# Patient Record
Sex: Female | Born: 1990 | Race: White | Hispanic: No | Marital: Married | Smoking: Former smoker
Health system: Southern US, Community
[De-identification: ages and names within clinical notes are randomized; demographics above are authoritative.]

## PROBLEM LIST (undated history)

## (undated) ENCOUNTER — Inpatient Hospital Stay (HOSPITAL_COMMUNITY): Payer: Self-pay

## (undated) DIAGNOSIS — N920 Excessive and frequent menstruation with regular cycle: Secondary | ICD-10-CM

## (undated) DIAGNOSIS — R1031 Right lower quadrant pain: Secondary | ICD-10-CM

## (undated) DIAGNOSIS — K589 Irritable bowel syndrome without diarrhea: Secondary | ICD-10-CM

## (undated) DIAGNOSIS — R319 Hematuria, unspecified: Secondary | ICD-10-CM

## (undated) DIAGNOSIS — F988 Other specified behavioral and emotional disorders with onset usually occurring in childhood and adolescence: Secondary | ICD-10-CM

## (undated) DIAGNOSIS — R635 Abnormal weight gain: Secondary | ICD-10-CM

## (undated) DIAGNOSIS — Z309 Encounter for contraceptive management, unspecified: Principal | ICD-10-CM

## (undated) DIAGNOSIS — N946 Dysmenorrhea, unspecified: Secondary | ICD-10-CM

## (undated) DIAGNOSIS — R350 Frequency of micturition: Principal | ICD-10-CM

## (undated) HISTORY — DX: Other specified behavioral and emotional disorders with onset usually occurring in childhood and adolescence: F98.8

## (undated) HISTORY — PX: KNEE ARTHROSCOPY: SHX127

## (undated) HISTORY — PX: WISDOM TOOTH EXTRACTION: SHX21

## (undated) HISTORY — DX: Irritable bowel syndrome without diarrhea: K58.9

## (undated) HISTORY — DX: Abnormal weight gain: R63.5

## (undated) HISTORY — DX: Right lower quadrant pain: R10.31

## (undated) HISTORY — DX: Hematuria, unspecified: R31.9

## (undated) HISTORY — DX: Encounter for contraceptive management, unspecified: Z30.9

## (undated) HISTORY — DX: Excessive and frequent menstruation with regular cycle: N92.0

## (undated) HISTORY — DX: Dysmenorrhea, unspecified: N94.6

## (undated) HISTORY — DX: Frequency of micturition: R35.0

---
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
## 2012-06-26 ENCOUNTER — Ambulatory Visit (INDEPENDENT_AMBULATORY_CARE_PROVIDER_SITE_OTHER): Payer: Federal, State, Local not specified - PPO | Admitting: Adult Health

## 2012-06-26 ENCOUNTER — Encounter: Payer: Self-pay | Admitting: Adult Health

## 2012-06-26 VITALS — BP 124/86 | HR 82 | Ht 64.5 in | Wt 174.0 lb

## 2012-06-26 DIAGNOSIS — Z3049 Encounter for surveillance of other contraceptives: Secondary | ICD-10-CM

## 2012-06-26 DIAGNOSIS — N920 Excessive and frequent menstruation with regular cycle: Secondary | ICD-10-CM

## 2012-06-26 DIAGNOSIS — Z309 Encounter for contraceptive management, unspecified: Secondary | ICD-10-CM

## 2012-06-26 DIAGNOSIS — F988 Other specified behavioral and emotional disorders with onset usually occurring in childhood and adolescence: Secondary | ICD-10-CM

## 2012-06-26 DIAGNOSIS — N946 Dysmenorrhea, unspecified: Secondary | ICD-10-CM

## 2012-06-26 HISTORY — DX: Dysmenorrhea, unspecified: N94.6

## 2012-06-26 HISTORY — DX: Excessive and frequent menstruation with regular cycle: N92.0

## 2012-06-26 NOTE — Progress Notes (Signed)
Subjective:     Patient ID: Margaret Pierce, female   DOB: [birth year], 22 y.o.   MRN: [id number]  Margaret Pierce is a 22 year old WF in today to discuss birth control and her periods, which have been heavy and painful.She sees Dr. Dwana Melena. She recently had her pills changed and it has not been a month yet. Will continue these for a total of 3 months if not  Better will change at follow up visit.   Review of Systems Patient denies any blurred vision, shortness of breath, chest pain, abdominal pain, problems with bowel movements, urination, or intercourse. She does have occasional headache, and she says her periods are heavy, lasting 7 days with cramps. She changes her tampons every 3-4 hours. She says she uses OTC Pamprin some. Denies any mood changes. Past medical, surgical, social and family history. Reviewed medications and allergies.     Objective:   Physical Exam Vital signs: Blood pressure 124/86, pulse 82 and regular, weight 174 lbs., height 64.5 inches. BMI 24.42. skin is warm and dry, neck midline trachea and thyroid normal, lungs clear to auscultation bilaterally, cardiovascular regular rate and rhythm.     Assessment:      Contraceptive management Menorrhagia Dysmenorrhea History of ADD    Plan:      Take Microgestin Fe 1.5-30 as prescribed. Return to clinic in 3 months in follow-up to discuss pills and periods, if not better will change pills.

## 2012-06-26 NOTE — Patient Instructions (Addendum)
Continue current pills, if after the 3rd pack periods not good, call me and we will change birth control pills. Pap at 23. Sign up for my chart

## 2012-07-16 ENCOUNTER — Encounter: Payer: Self-pay | Admitting: *Deleted

## 2012-07-16 NOTE — Progress Notes (Signed)
rx for vyvanse 70mg  dated for 04/23/2012 wasn't picked up and was shredded on 07/16/2012

## 2012-09-26 ENCOUNTER — Ambulatory Visit (INDEPENDENT_AMBULATORY_CARE_PROVIDER_SITE_OTHER): Payer: Federal, State, Local not specified - PPO | Admitting: Adult Health

## 2012-09-26 ENCOUNTER — Encounter: Payer: Self-pay | Admitting: Adult Health

## 2012-09-26 VITALS — BP 112/72 | Ht 64.5 in | Wt 179.0 lb

## 2012-09-26 DIAGNOSIS — Z3202 Encounter for pregnancy test, result negative: Secondary | ICD-10-CM

## 2012-09-26 DIAGNOSIS — Z3200 Encounter for pregnancy test, result unknown: Secondary | ICD-10-CM

## 2012-09-26 DIAGNOSIS — N920 Excessive and frequent menstruation with regular cycle: Secondary | ICD-10-CM

## 2012-09-26 DIAGNOSIS — Z309 Encounter for contraceptive management, unspecified: Secondary | ICD-10-CM

## 2012-09-26 DIAGNOSIS — Z3049 Encounter for surveillance of other contraceptives: Secondary | ICD-10-CM

## 2012-09-26 DIAGNOSIS — N946 Dysmenorrhea, unspecified: Secondary | ICD-10-CM

## 2012-09-26 LAB — POCT URINE PREGNANCY: Preg Test, Ur: NEGATIVE

## 2012-09-26 MED ORDER — NORGESTIMATE-ETH ESTRADIOL 0.25-35 MG-MCG PO TABS: 1.0000 | ORAL_TABLET | Freq: Every day | ORAL | Status: DC

## 2012-09-26 NOTE — Progress Notes (Signed)
Subjective:     Patient ID: Margaret Pierce, female   DOB: Jun 11, 1990, 22 y.o.   MRN: [id number]  HPI Margaret Pierce is back today to review how she is doing on the Loestrin 1.5/30 fe.She still has heavy flow and cramps, and desires a change in pills.  Review of Systems Positives in HPI Reviewed past medical,surgical, social and family history. Reviewed medications and allergies.     Objective:   Physical Exam BP 112/72  Ht 5' 4.5" (1.638 m)  Wt 179 lb (81.194 kg)  BMI 30.26 kg/m2  LMP 06/24/2014Urine pregnancy test negative.Will try higher dose pill.    Assessment:      Contraceptive management Menorrhagia Dysmenorrhea    Plan:     Rx Sprintec disp 1 pack take 1 daily with 11 refills, can start today and use condoms Return in 3 months for follow up  Call prn problems or concerns

## 2012-09-26 NOTE — Patient Instructions (Addendum)
Start sprintec today  follow up in 3 months USe condoms

## 2012-12-29 ENCOUNTER — Ambulatory Visit: Payer: Federal, State, Local not specified - PPO | Admitting: Adult Health

## 2013-05-27 ENCOUNTER — Other Ambulatory Visit: Payer: Self-pay | Admitting: Adult Health

## 2013-08-18 ENCOUNTER — Telehealth: Payer: Self-pay | Admitting: Adult Health

## 2013-08-18 NOTE — Telephone Encounter (Signed)
Pt states due to weight gain would like to switch to another birth control. Call transferred to front staff for an appt to be made with Cyril MourningJennifer Griffin, NP.

## 2013-08-19 ENCOUNTER — Ambulatory Visit (INDEPENDENT_AMBULATORY_CARE_PROVIDER_SITE_OTHER): Payer: Federal, State, Local not specified - PPO | Admitting: Adult Health

## 2013-08-19 ENCOUNTER — Encounter: Payer: Self-pay | Admitting: Adult Health

## 2013-08-19 VITALS — BP 112/78 | Ht 64.5 in | Wt 192.0 lb

## 2013-08-19 DIAGNOSIS — Z3049 Encounter for surveillance of other contraceptives: Secondary | ICD-10-CM

## 2013-08-19 DIAGNOSIS — R635 Abnormal weight gain: Secondary | ICD-10-CM

## 2013-08-19 DIAGNOSIS — R319 Hematuria, unspecified: Secondary | ICD-10-CM

## 2013-08-19 DIAGNOSIS — Z309 Encounter for contraceptive management, unspecified: Secondary | ICD-10-CM

## 2013-08-19 DIAGNOSIS — Z3202 Encounter for pregnancy test, result negative: Secondary | ICD-10-CM

## 2013-08-19 DIAGNOSIS — R300 Dysuria: Secondary | ICD-10-CM

## 2013-08-19 HISTORY — DX: Abnormal weight gain: R63.5

## 2013-08-19 HISTORY — DX: Encounter for contraceptive management, unspecified: Z30.9

## 2013-08-19 LAB — POCT URINALYSIS DIPSTICK
GLUCOSE UA: NEGATIVE
Ketones, UA: NEGATIVE
LEUKOCYTES UA: NEGATIVE
Nitrite, UA: NEGATIVE
PROTEIN UA: NEGATIVE
RBC UA: 3

## 2013-08-19 LAB — POCT URINE PREGNANCY: PREG TEST UR: NEGATIVE

## 2013-08-19 MED ORDER — NORETHIN ACE-ETH ESTRAD-FE 1-20 MG-MCG(24) PO CHEW: CHEWABLE_TABLET | ORAL | Status: DC

## 2013-08-19 NOTE — Patient Instructions (Signed)
Oral Contraception Use Oral contraceptive pills (OCPs) are medicines taken to prevent pregnancy. OCPs work by preventing the ovaries from releasing eggs. The hormones in OCPs also cause the cervical mucus to thicken, preventing the sperm from entering the uterus. The hormones also cause the uterine lining to become thin, not allowing a fertilized egg to attach to the inside of the uterus. OCPs are highly effective when taken exactly as prescribed. However, OCPs do not prevent sexually transmitted diseases (STDs). Safe sex practices, such as using condoms along with an OCP, can help prevent STDs. Before taking OCPs, you may have a physical exam and Pap test. Your health care provider may also order blood tests if necessary. Your health care provider will make sure you are a good candidate for oral contraception. Discuss with your health care provider the possible side effects of the OCP you may be prescribed. When starting an OCP, it can take 2 to 3 months for the body to adjust to the changes in hormone levels in your body.  HOW TO TAKE ORAL CONTRACEPTIVE PILLS Your health care provider may advise you on how to start taking the first cycle of OCPs. Otherwise, you can:   Start on day 1 of your menstrual period. You will not need any backup contraceptive protection with this start time.   Start on the first Sunday after your menstrual period or the day you get your prescription. In these cases, you will need to use backup contraceptive protection for the first week.   Start the pill at any time of your cycle. If you take the pill within 5 days of the start of your period, you are protected against pregnancy right away. In this case, you will not need a backup form of birth control. If you start at any other time of your menstrual cycle, you will need to use another form of birth control for 7 days. If your OCP is the type called a minipill, it will protect you from pregnancy after taking it for 2 days (48  hours). After you have started taking OCPs:   If you forget to take 1 pill, take it as soon as you remember. Take the next pill at the regular time.   If you miss 2 or more pills, call your health care provider because different pills have different instructions for missed doses. Use backup birth control until your next menstrual period starts.   If you use a 28-day pack that contains inactive pills and you miss 1 of the last 7 pills (pills with no hormones), it will not matter. Throw away the rest of the nonhormone pills and start a new pill pack.  No matter which day you start the OCP, you will always start a new pack on that same day of the week. Have an extra pack of OCPs and a backup contraceptive method available in case you miss some pills or lose your OCP pack.  HOME CARE INSTRUCTIONS   Do not smoke.   Always use a condom to protect against STDs. OCPs do not protect against STDs.   Use a calendar to mark your menstrual period days.   Read the information and directions that came with your OCP. Talk to your health care provider if you have questions.  SEEK MEDICAL CARE IF:   You develop nausea and vomiting.   You have abnormal vaginal discharge or bleeding.   You develop a rash.   You miss your menstrual period.   You are losing   your hair.   You need treatment for mood swings or depression.   You get dizzy when taking the OCP.   You develop acne from taking the OCP.   You become pregnant.  SEEK IMMEDIATE MEDICAL CARE IF:   You develop chest pain.   You develop shortness of breath.   You have an uncontrolled or severe headache.   You develop numbness or slurred speech.   You develop visual problems.   You develop pain, redness, and swelling in the legs.  Document Released: 03/08/2011 Document Revised: 11/19/2012 Document Reviewed: 09/07/2012 [facility] Patient Information 2014 [place], [place]. Start OCs Sunday  Use condoms Return in 3  months for pap

## 2013-08-19 NOTE — Progress Notes (Signed)
Subjective:     Patient ID: Benard HalstedKalynn L Dillard, female   DOB: 08/18/1990, 22 y.o.   MRN: [id number]  HPI [name] is a 23 year old white female, single in complaining of weight gain with OCs, is going to weight watchers.She also complains of burning with urination at times.Is on pills to help with periods.  Review of Systems See HPI Reviewed past medical,surgical, social and family history. Reviewed medications and allergies.     Objective:   Physical Exam BP 112/78  Ht 5' 4.5" (1.638 m)  Wt 192 lb (87.091 kg)  BMI 32.46 kg/m2On period now, UPT negative, urine dipstick 3+ blood,talk only, will try changing OCs to see if helps shorten periods and helps with weight, did discuss other options like depo, nexplanon and IUD or nuva ring and she wants OCs.    Assessment:     Contraceptive management Weight gain Hematuria Burns with urination     Plan:    UA C&S sent  Given 4 packs minastrin lot #[id number] A exp 5/16, start Sunday,  Return in 3 months for pap and physical and ROS  Use condoms   Review handout on OC use

## 2013-08-20 LAB — URINALYSIS
BILIRUBIN URINE: NEGATIVE
Glucose, UA: NEGATIVE mg/dL
Ketones, ur: NEGATIVE mg/dL
Leukocytes, UA: NEGATIVE
Nitrite: NEGATIVE
PH: 6.5 (ref 5.0–8.0)
Protein, ur: NEGATIVE mg/dL
Specific Gravity, Urine: 1.011 (ref 1.005–1.030)
Urobilinogen, UA: 0.2 mg/dL (ref 0.0–1.0)

## 2013-08-21 LAB — URINE CULTURE
Colony Count: NO GROWTH
ORGANISM ID, BACTERIA: NO GROWTH

## 2013-11-18 ENCOUNTER — Ambulatory Visit (INDEPENDENT_AMBULATORY_CARE_PROVIDER_SITE_OTHER): Payer: Federal, State, Local not specified - PPO | Admitting: Adult Health

## 2013-11-18 ENCOUNTER — Encounter: Payer: Self-pay | Admitting: Adult Health

## 2013-11-18 ENCOUNTER — Other Ambulatory Visit (HOSPITAL_COMMUNITY)
Admission: RE | Admit: 2013-11-18 | Discharge: 2013-11-18 | Disposition: A | Discharge status: Home / Self Care | Payer: Federal, State, Local not specified - PPO | Source: Ambulatory Visit | Attending: Adult Health | Admitting: Adult Health

## 2013-11-18 VITALS — BP 120/76 | HR 78 | Ht 65.0 in | Wt 193.0 lb

## 2013-11-18 DIAGNOSIS — Z01419 Encounter for gynecological examination (general) (routine) without abnormal findings: Secondary | ICD-10-CM

## 2013-11-18 DIAGNOSIS — Z3041 Encounter for surveillance of contraceptive pills: Secondary | ICD-10-CM

## 2013-11-18 DIAGNOSIS — Z113 Encounter for screening for infections with a predominantly sexual mode of transmission: Secondary | ICD-10-CM | POA: Insufficient documentation

## 2013-11-18 MED ORDER — NORETHIN ACE-ETH ESTRAD-FE 1-20 MG-MCG(24) PO CHEW: CHEWABLE_TABLET | ORAL | Status: DC

## 2013-11-18 NOTE — Progress Notes (Signed)
Patient ID: Margaret Pierce, female   DOB: 06/10/1990, 23 y.o.   MRN: [id number] History of Present Illness: Margaret Pierce is a 23 year old white female in for her first pap and physical.She hs been on minastrin and loves them, her cramps are much better and  Periods are light.  Current Medications, Allergies, Past Medical History, Past Surgical History, Family History and Social History [name].     Review of Systems: Patient denies any headaches, blurred vision, shortness of breath, chest pain, abdominal pain, problems with bowel movements, urination, or intercourse.  No joint pain  Or mood swings.   Physical Exam:BP 120/76  Pulse 78  Ht 5\' 5"  (1.651 m)  Wt 193 lb (87.544 kg)  BMI 32.12 kg/m2  LMP 11/10/2013 General:  Well developed, well nourished, no acute distress Skin:  Warm and dry Neck:  Midline trachea, normal thyroid Lungs; Clear to auscultation bilaterally Breast:  No dominant palpable mass, retraction, or nipple discharge Cardiovascular: Regular rate and rhythm Abdomen:  Soft, non tender, no hepatosplenomegaly Pelvic:  External genitalia is normal in appearance.  The vagina is normal in appearance.The cervix is nulliparous and smooth,pap with GC/CHL performed .  Uterus is felt to be normal size, shape, and contour.  No  adnexal masses or tenderness noted. Extremities:  No swelling or varicosities noted Psych:  No mood changes, alert and cooperative,seems happy   Impression: Yearly gyn exam  Contraceptive management    Plan: Physical in 1 year Pap in 2  Years Refilled minastrin 1 daily disp 1 pack refill x 11 with discount card

## 2013-11-18 NOTE — Patient Instructions (Signed)
Physical in 1 year Pap in 2 years  

## 2013-11-19 ENCOUNTER — Other Ambulatory Visit: Payer: Federal, State, Local not specified - PPO | Admitting: Adult Health

## 2013-11-19 LAB — CYTOLOGY - PAP

## 2014-03-22 ENCOUNTER — Ambulatory Visit (INDEPENDENT_AMBULATORY_CARE_PROVIDER_SITE_OTHER): Payer: Federal, State, Local not specified - PPO | Admitting: Adult Health

## 2014-03-22 ENCOUNTER — Encounter: Payer: Self-pay | Admitting: Adult Health

## 2014-03-22 VITALS — BP 128/70 | Ht 64.5 in | Wt 202.0 lb

## 2014-03-22 DIAGNOSIS — R319 Hematuria, unspecified: Secondary | ICD-10-CM | POA: Insufficient documentation

## 2014-03-22 DIAGNOSIS — R350 Frequency of micturition: Secondary | ICD-10-CM

## 2014-03-22 DIAGNOSIS — R1031 Right lower quadrant pain: Secondary | ICD-10-CM

## 2014-03-22 HISTORY — DX: Hematuria, unspecified: R31.9

## 2014-03-22 HISTORY — DX: Right lower quadrant pain: R10.31

## 2014-03-22 HISTORY — DX: Frequency of micturition: R35.0

## 2014-03-22 LAB — POCT URINALYSIS DIPSTICK

## 2014-03-22 MED ORDER — FLUCONAZOLE 150 MG PO TABS: ORAL_TABLET | ORAL | Status: DC

## 2014-03-22 MED ORDER — NITROFURANTOIN MONOHYD MACRO 100 MG PO CAPS: 100.0000 mg | ORAL_CAPSULE | Freq: Two times a day (BID) | ORAL | Status: DC

## 2014-03-22 NOTE — Patient Instructions (Signed)
Pelvic Pain Female pelvic pain can be caused by many different things and start from a variety of places. Pelvic pain refers to pain that is located in the lower half of the abdomen and between your hips. The pain may occur over a short period of time (acute) or may be reoccurring (chronic). The cause of pelvic pain may be related to disorders affecting the female reproductive organs (gynecologic), but it may also be related to the bladder, kidney stones, an intestinal complication, or muscle or skeletal problems. Getting help right away for pelvic pain is important, especially if there has been severe, sharp, or a sudden onset of unusual pain. It is also important to get help right away because some types of pelvic pain can be life threatening.  CAUSES  Below are only some of the causes of pelvic pain. The causes of pelvic pain can be in one of several categories.  Gynecologic. Pelvic inflammatory disease. Sexually transmitted infection. Ovarian cyst or a twisted ovarian ligament (ovarian torsion). Uterine lining that grows outside the uterus (endometriosis). Fibroids, cysts, or tumors. Ovulation. Pregnancy. Pregnancy that occurs outside the uterus (ectopic pregnancy). Miscarriage. Labor. Abruption of the placenta or ruptured uterus. Infection. Uterine infection (endometritis). Bladder infection. Diverticulitis. Miscarriage related to a uterine infection (septic abortion). Bladder. Inflammation of the bladder (cystitis). Kidney stone(s). Gastrointestinal. Constipation. Diverticulitis. Neurologic. Trauma. Feeling pelvic pain because of mental or emotional causes (psychosomatic). Cancers of the bowel or pelvis. EVALUATION  Your caregiver will want to take a careful history of your concerns. This includes recent changes in your health, a careful gynecologic history of your periods (menses), and a sexual history. Obtaining your family history and medical history is also important. Your  caregiver may suggest a pelvic exam. A pelvic exam will help identify the location and severity of the pain. It also helps in the evaluation of which organ system may be involved. In order to identify the cause of the pelvic pain and be properly treated, your caregiver may order tests. These tests may include:  A pregnancy test. Pelvic ultrasonography. An X-ray exam of the abdomen. A urinalysis or evaluation of vaginal discharge. Blood tests. HOME CARE INSTRUCTIONS  Only take over-the-counter or prescription medicines for pain, discomfort, or fever as directed by your caregiver.  Rest as directed by your caregiver.  Eat a balanced diet.  Drink enough fluids to make your urine clear or pale yellow, or as directed.  Avoid sexual intercourse if it causes pain.  Apply warm or cold compresses to the lower abdomen depending on which one helps the pain.  Avoid stressful situations.  Keep a journal of your pelvic pain. Write down when it started, where the pain is located, and if there are things that seem to be associated with the pain, such as food or your menstrual cycle. Follow up with your caregiver as directed.  SEEK MEDICAL CARE IF: Your medicine does not help your pain. You have abnormal vaginal discharge. SEEK IMMEDIATE MEDICAL CARE IF:  You have heavy bleeding from the vagina.  Your pelvic pain increases.  You feel light-headed or faint.  You have chills.  You have pain with urination or blood in your urine.  You have uncontrolled diarrhea or vomiting.  You have a fever or persistent symptoms for more than 3 days. You have a fever and your symptoms suddenly get worse.  You are being physically or sexually abused.  MAKE SURE YOU: Understand these instructions. Will watch your condition. Will get help if you  are not doing well or get worse. Document Released: 02/14/2004 Document Revised: 08/03/2013 Document Reviewed: 07/09/2011 [facility] Patient Information 2015  [place]Care, [place]. This information is not intended to replace advice given to you by your health care provider. Make sure you discuss any questions you have with your health care provider. Hematuria Hematuria is blood in your urine. It can be caused by a bladder infection, kidney infection, prostate infection, kidney stone, or cancer of your urinary tract. Infections can usually be treated with medicine, and a kidney stone usually will pass through your urine. If neither of these is the cause of your hematuria, further workup to find out the reason may be needed. It is very important that you tell your health care provider about any blood you see in your urine, even if the blood stops without treatment or happens without causing pain. Blood in your urine that happens and then stops and then happens again can be a symptom of a very serious condition. Also, pain is not a symptom in the initial stages of many urinary cancers. HOME CARE INSTRUCTIONS   Drink lots of fluid, 3-4 quarts a day. If you have been diagnosed with an infection, cranberry juice is especially recommended, in addition to large amounts of water.  Avoid caffeine, tea, and carbonated beverages because they tend to irritate the bladder.  Avoid alcohol because it may irritate the prostate.  Take all medicines as directed by your health care provider.  If you were prescribed an antibiotic medicine, finish it all even if you start to feel better.  If you have been diagnosed with a kidney stone, follow your health care provider's instructions regarding straining your urine to catch the stone.  Empty your bladder often. Avoid holding urine for long periods of time.  After a bowel movement, women should cleanse front to back. Use each tissue only once.  Empty your bladder before and after sexual intercourse if you are a female. SEEK MEDICAL CARE IF:  You develop back pain.  You have a fever.  You have a feeling of sickness in your  stomach (nausea) or vomiting.  Your symptoms are not better in 3 days. Return sooner if you are getting worse. SEEK IMMEDIATE MEDICAL CARE IF:   You develop severe vomiting and are unable to keep the medicine down.  You develop severe back or abdominal pain despite taking your medicines.  You begin passing a large amount of blood or clots in your urine.  You feel extremely weak or faint, or you pass out. MAKE SURE YOU:   Understand these instructions.  Will watch your condition.  Will get help right away if you are not doing well or get worse. Document Released: 03/19/2005 Document Revised: 08/03/2013 Document Reviewed: 11/17/2012 [facility] Patient Information 2015 [place]ExitCare, [place]LL[place]. This information is not intended to replace advice given to you by your health care provider. Make sure you discuss any questions you have with your health care provider. Urinary Tract Infection Urinary tract infections (UTIs) can develop anywhere along your urinary tract. Your urinary tract is your body's drainage system for removing wastes and extra water. Your urinary tract includes two kidneys, two ureters, a bladder, and a urethra. Your kidneys are a pair of bean-shaped organs. Each kidney is about the size of your fist. They are located below your ribs, one on each side of your spine. CAUSES Infections are caused by microbes, which are microscopic organisms, including fungi, viruses, and bacteria. These organisms are so small that they  can only be seen through a microscope. Bacteria are the microbes that most commonly cause UTIs. SYMPTOMS  Symptoms of UTIs may vary by age and gender of the patient and by the location of the infection. Symptoms in young women typically include a frequent and intense urge to urinate and a painful, burning feeling in the bladder or urethra during urination. Older women and men are more likely to be tired, shaky, and weak and have muscle aches and abdominal pain. A fever may  mean the infection is in your kidneys. Other symptoms of a kidney infection include pain in your back or sides below the ribs, nausea, and vomiting. DIAGNOSIS To diagnose a UTI, your caregiver will ask you about your symptoms. Your caregiver also will ask to provide a urine sample. The urine sample will be tested for bacteria and white blood cells. White blood cells are made by your body to help fight infection. TREATMENT  Typically, UTIs can be treated with medication. Because most UTIs are caused by a bacterial infection, they usually can be treated with the use of antibiotics. The choice of antibiotic and length of treatment depend on your symptoms and the type of bacteria causing your infection. HOME CARE INSTRUCTIONS  If you were prescribed antibiotics, take them exactly as your caregiver instructs you. Finish the medication even if you feel better after you have only taken some of the medication.  Drink enough water and fluids to keep your urine clear or pale yellow.  Avoid caffeine, tea, and carbonated beverages. They tend to irritate your bladder.  Empty your bladder often. Avoid holding urine for long periods of time.  Empty your bladder before and after sexual intercourse.  After a bowel movement, women should cleanse from front to back. Use each tissue only once. SEEK MEDICAL CARE IF:   You have back pain.  You develop a fever.  Your symptoms do not begin to resolve within 3 days. SEEK IMMEDIATE MEDICAL CARE IF:   You have severe back pain or lower abdominal pain.  You develop chills.  You have nausea or vomiting.  You have continued burning or discomfort with urination. MAKE SURE YOU:   Understand these instructions.  Will watch your condition.  Will get help right away if you are not doing well or get worse. Document Released: 12/27/2004 Document Revised: 09/18/2011 Document Reviewed: 04/27/2011 [facility]Exit[facility] Patient Information 2015 [place]ExitCare, [place]. This information  is not intended to replace advice given to you by your health care provider. Make sure you discuss any questions you have with your health care provider. Take macrobid and diflucan  Return 12.30 for US push fluids

## 2014-03-22 NOTE — Progress Notes (Signed)
Subjective:     Patient ID: Margaret Pierce, female   DOB: 1991/03/22, 23 y.o.   MRN: [id number]  HPI Margaret Pierce is a 23 year old white female in complaining of urinary frequency and bladder pressure, and burning in vagina, and low back pain.She says this started about a month ago and was treated by PCP with flagyl,bactrum then cipro, felt better with cipro but it has started back.  Review of Systems See HPI Reviewed past medical,surgical, social and family history. Reviewed medications and allergies.     Objective:   Physical Exam BP 128/70 mmHg  Ht 5' 4.5" (1.638 m)  Wt 202 lb (91.627 kg)  BMI 34.15 kg/m2  LMP 12/07/2015urine 1+ blood   Skin warm and dry.Pelvic: external genitalia is normal in appearance, vagina: scant discharge without odor, cervix:smooth, uterus: normal size, shape and contour, non tender, no masses felt, adnexa: no masses, RLQ tenderness noted. Mild left CVAT and low back tenderness, she has had this for long while.  Assessment:     Urinary frequency  RLQ pain  Hematuria     Plan:    Rx macrobid 1 bid x 7 days #14 no refills Rx diflucan 150 mg #2 1 now and 1 in 3 days with 1 refill Push fluids Return 12/30 for US and see me to R/O ovarian cyst Review handout on UTI,hematuria and pelvic pain    UA C&S sent, check CBC,CMP and TSH

## 2014-03-23 ENCOUNTER — Telehealth: Payer: Self-pay | Admitting: Adult Health

## 2014-03-23 LAB — COMPREHENSIVE METABOLIC PANEL
ALBUMIN: 4.3 g/dL (ref 3.5–5.2)
ALK PHOS: 75 U/L (ref 39–117)
ALT: 17 U/L (ref 0–35)
AST: 14 U/L (ref 0–37)
BUN: 14 mg/dL (ref 6–23)
CALCIUM: 9.8 mg/dL (ref 8.4–10.5)
CO2: 26 meq/L (ref 19–32)
CREATININE: 0.73 mg/dL (ref 0.50–1.10)
Chloride: 103 mEq/L (ref 96–112)
Glucose, Bld: 82 mg/dL (ref 70–99)
POTASSIUM: 4.3 meq/L (ref 3.5–5.3)
Sodium: 139 mEq/L (ref 135–145)
Total Bilirubin: 0.3 mg/dL (ref 0.2–1.2)
Total Protein: 7 g/dL (ref 6.0–8.3)

## 2014-03-23 LAB — URINALYSIS
BILIRUBIN URINE: NEGATIVE
GLUCOSE, UA: NEGATIVE mg/dL
KETONES UR: NEGATIVE mg/dL
LEUKOCYTES UA: NEGATIVE
NITRITE: NEGATIVE
PH: 6.5 (ref 5.0–8.0)
PROTEIN: NEGATIVE mg/dL
SPECIFIC GRAVITY, URINE: 1.022 (ref 1.005–1.030)
Urobilinogen, UA: 0.2 mg/dL (ref 0.0–1.0)

## 2014-03-23 LAB — CBC
HCT: 41.1 % (ref 36.0–46.0)
HEMOGLOBIN: 14 g/dL (ref 12.0–15.0)
MCH: 32.1 pg (ref 26.0–34.0)
MCHC: 34.1 g/dL (ref 30.0–36.0)
MCV: 94.3 fL (ref 78.0–100.0)
MPV: 11.6 fL (ref 9.4–12.4)
Platelets: 272 10*3/uL (ref 150–400)
RBC: 4.36 MIL/uL (ref 3.87–5.11)
RDW: 12.6 % (ref 11.5–15.5)
WBC: 10 10*3/uL (ref 4.0–10.5)

## 2014-03-23 LAB — TSH: TSH: 1.363 u[IU]/mL (ref 0.350–4.500)

## 2014-03-23 NOTE — Telephone Encounter (Signed)
Left message labs normal and trace of blood in urine

## 2014-03-24 ENCOUNTER — Telehealth: Payer: Self-pay | Admitting: Adult Health

## 2014-03-24 LAB — URINE CULTURE
Colony Count: NO GROWTH
ORGANISM ID, BACTERIA: NO GROWTH

## 2014-03-24 NOTE — Telephone Encounter (Signed)
Pt feels better, aware urine trace blood with negative culture, finish meds

## 2014-03-31 ENCOUNTER — Encounter: Payer: Self-pay | Admitting: Adult Health

## 2014-03-31 ENCOUNTER — Ambulatory Visit (INDEPENDENT_AMBULATORY_CARE_PROVIDER_SITE_OTHER): Payer: Federal, State, Local not specified - PPO

## 2014-03-31 ENCOUNTER — Other Ambulatory Visit: Payer: Self-pay | Admitting: Adult Health

## 2014-03-31 ENCOUNTER — Ambulatory Visit (INDEPENDENT_AMBULATORY_CARE_PROVIDER_SITE_OTHER): Payer: Federal, State, Local not specified - PPO | Admitting: Adult Health

## 2014-03-31 VITALS — BP 112/70 | Ht 64.5 in | Wt 202.0 lb

## 2014-03-31 DIAGNOSIS — R109 Unspecified abdominal pain: Secondary | ICD-10-CM

## 2014-03-31 DIAGNOSIS — R1031 Right lower quadrant pain: Secondary | ICD-10-CM

## 2014-03-31 DIAGNOSIS — M545 Low back pain, unspecified: Secondary | ICD-10-CM

## 2014-03-31 DIAGNOSIS — R208 Other disturbances of skin sensation: Secondary | ICD-10-CM

## 2014-03-31 LAB — POCT URINALYSIS DIPSTICK
GLUCOSE UA: NEGATIVE
Ketones, UA: NEGATIVE
Leukocytes, UA: NEGATIVE
Nitrite, UA: NEGATIVE
Protein, UA: NEGATIVE
RBC UA: NEGATIVE
Urobilinogen, UA: NEGATIVE

## 2014-03-31 MED ORDER — FLUCONAZOLE 150 MG PO TABS: ORAL_TABLET | ORAL | Status: DC

## 2014-03-31 MED ORDER — CYCLOBENZAPRINE HCL 5 MG PO TABS: 5.0000 mg | ORAL_TABLET | Freq: Three times a day (TID) | ORAL | Status: DC | PRN

## 2014-03-31 NOTE — Patient Instructions (Signed)
Try ice and flexerill and stretches Try diflucan  Increase water try lemon aide or cranberry juice Back Pain, Adult Low back pain is very common. About 1 in 5 people have back pain.The cause of low back pain is rarely dangerous. The pain often gets better over time.About half of people with a sudden onset of back pain feel better in just 2 weeks. About 8 in 10 people feel better by 6 weeks.  CAUSES Some common causes of back pain include:  Strain of the muscles or ligaments supporting the spine.  Wear and tear (degeneration) of the spinal discs.  Arthritis.  Direct injury to the back. DIAGNOSIS Most of the time, the direct cause of low back pain is not known.However, back pain can be treated effectively even when the exact cause of the pain is unknown.Answering your caregiver's questions about your overall health and symptoms is one of the most accurate ways to make sure the cause of your pain is not dangerous. If your caregiver needs more information, he or she may order lab work or imaging tests (X-rays or MRIs).However, even if imaging tests show changes in your back, this usually does not require surgery. HOME CARE INSTRUCTIONS For many people, back pain returns.Since low back pain is rarely dangerous, it is often a condition that people can learn to [facility] their own.   Remain active. It is stressful on the back to sit or stand in one place. Do not sit, drive, or stand in one place for more than 30 minutes at a time. Take short walks on level surfaces as soon as pain allows.Try to increase the length of time you walk each day.  Do not stay in bed.Resting more than 1 or 2 days can delay your recovery.  Do not avoid exercise or work.Your body is made to move.It is not dangerous to be active, even though your back may hurt.Your back will likely heal faster if you return to being active before your pain is gone.  Pay attention to your body when you bend and lift. Many people  have less discomfortwhen lifting if they bend their knees, keep the load close to their bodies,and avoid twisting. Often, the most comfortable positions are those that put less stress on your recovering back.  Find a comfortable position to sleep. Use a firm mattress and lie on your side with your knees slightly bent. If you lie on your back, put a pillow under your knees.  Only take over-the-counter or prescription medicines as directed by your caregiver. Over-the-counter medicines to reduce pain and inflammation are often the most helpful.Your caregiver may prescribe muscle relaxant drugs.These medicines help dull your pain so you can more quickly return to your normal activities and healthy exercise.  Put ice on the injured area.  Put ice in a plastic bag.  Place a towel between your skin and the bag.  Leave the ice on for 15-20 minutes, 03-04 times a day for the first 2 to 3 days. After that, ice and heat may be alternated to reduce pain and spasms.  Ask your caregiver about trying back exercises and gentle massage. This may be of some benefit.  Avoid feeling anxious or stressed.Stress increases muscle tension and can worsen back pain.It is important to recognize when you are anxious or stressed and learn ways to manage it.Exercise is a great option. SEEK MEDICAL CARE IF:  You have pain that is not relieved with rest or medicine.  You have pain that does not  improve in 1 week.  You have new symptoms.  You are generally not feeling well. SEEK IMMEDIATE MEDICAL CARE IF:   You have pain that radiates from your back into your legs.  You develop new bowel or bladder control problems.  You have unusual weakness or numbness in your arms or legs.  You develop nausea or vomiting.  You develop abdominal pain.  You feel faint. Document Released: 03/19/2005 Document Revised: 09/18/2011 Document Reviewed: 07/21/2013 [facility] Patient Information 2015 [place], [place]. This  information is not intended to replace advice given to you by your health care provider. Make sure you discuss any questions you have with your health care provider. Return in 2 weeks

## 2014-03-31 NOTE — Progress Notes (Signed)
Subjective:     Patient ID: Margaret Pierce, female   DOB: September 23, 1990, [address]   MRN: [id number]  HPI [name] is a 23 year old white female in for [place].Has had RLQ pain and back pain for about 1 month now.  Review of Systems See HPI Reviewed past medical,surgical, social and family history. Reviewed medications and allergies.     Objective:   Physical Exam BP 112/70 mmHg  Ht 5' 4.5" (1.638 m)  Wt 202 lb (91.627 kg)  BMI 34.15 kg/m2  LMP 12/07/2015urine negative, US reviewed with pt and Mom,    Uterus 7.6 x 4.3 x 3.7 cm, anteverted   Endometrium 3.1 mm, symmetrical,   Right ovary 2.2 x 1.9 x 1.6 cm,   Left ovary 2.2 x 1.5 x 14 cm,   No free fluid or adnexal masses noted within the pelvis  Bilateral kidneys-no hydronephrosis noted   Technician Comments:  Anteverted uterus, Endometrium-3.341mm,symmetrical, bilateral adnexa/ovaries appears WNL, no free fluid or adnexal masses noted within the pelvis, bilateral kidneys- no hydronephrosis noted bilaterally Has point tenderness R lower back that seems to radiate to RLQ,does lift at work.and vagina feels like it burns, was better after diflucan and macrobid. Discussed trying ice, stretches and flexeril for back and diflucan and increased water and cranberry juice or lemon aide for burning and follow up in 2 weeks.  Assessment:     RLQ pain  Right low back pain Burning sensation in vagina area    Plan:    Review handout on back pain Rx flexeril 5 mg #30 1 every 8 hours prn no refills Ice back and try stretches Rx diflucan 150 mg #2 1 now and 1 in 3 days with 1 refill Follow up in 2 weeks, if back better but still burning make appt to see Dr Despina HiddenEure

## 2014-04-16 ENCOUNTER — Encounter: Payer: Self-pay | Admitting: Adult Health

## 2014-04-16 ENCOUNTER — Ambulatory Visit: Payer: Federal, State, Local not specified - PPO | Admitting: Adult Health

## 2014-04-27 ENCOUNTER — Telehealth: Payer: Self-pay | Admitting: Adult Health

## 2014-04-27 MED ORDER — SULFAMETHOXAZOLE-TRIMETHOPRIM 800-160 MG PO TABS: 1.0000 | ORAL_TABLET | Freq: Two times a day (BID) | ORAL | Status: DC

## 2014-04-27 NOTE — Telephone Encounter (Signed)
Will rx septra ds  

## 2014-04-27 NOTE — Telephone Encounter (Signed)
Spoke with pt. Pt states she is having burning with urination and her urine is dark. She states she was treated recently for a UTI and wonders if you will order another med without her being seen. Please advise. Thanks!! JSY

## 2014-06-07 ENCOUNTER — Telehealth: Payer: Self-pay | Admitting: Adult Health

## 2014-06-07 NOTE — Telephone Encounter (Signed)
Complains of yeast on antibiotics refill diflucan

## 2014-06-11 ENCOUNTER — Other Ambulatory Visit: Payer: Self-pay | Admitting: Adult Health

## 2014-10-20 ENCOUNTER — Other Ambulatory Visit: Payer: Self-pay | Admitting: Adult Health

## 2014-11-25 ENCOUNTER — Encounter: Payer: Self-pay | Admitting: Adult Health

## 2014-11-25 ENCOUNTER — Ambulatory Visit (INDEPENDENT_AMBULATORY_CARE_PROVIDER_SITE_OTHER): Payer: Federal, State, Local not specified - PPO | Admitting: Adult Health

## 2014-11-25 VITALS — BP 120/60 | HR 84 | Ht 65.0 in | Wt 193.0 lb

## 2014-11-25 DIAGNOSIS — Z01419 Encounter for gynecological examination (general) (routine) without abnormal findings: Secondary | ICD-10-CM | POA: Diagnosis not present

## 2014-11-25 DIAGNOSIS — K589 Irritable bowel syndrome without diarrhea: Secondary | ICD-10-CM

## 2014-11-25 DIAGNOSIS — Z3041 Encounter for surveillance of contraceptive pills: Secondary | ICD-10-CM

## 2014-11-25 HISTORY — DX: Irritable bowel syndrome, unspecified: K58.9

## 2014-11-25 MED ORDER — NORETHIN ACE-ETH ESTRAD-FE 1-20 MG-MCG(24) PO CHEW: CHEWABLE_TABLET | ORAL | Status: DC

## 2014-11-25 MED ORDER — DICYCLOMINE HCL 20 MG PO TABS: 20.0000 mg | ORAL_TABLET | Freq: Three times a day (TID) | ORAL | Status: DC

## 2014-11-25 NOTE — Progress Notes (Signed)
Patient ID: Margaret Pierce, female   DOB: 12/18/1990, 24 y.o.   MRN: [id number] History of Present Illness: Margaret Pierce is a 24 year old white female, in for a well woman gyn exam and refill minastrin,she had a normal pap 11/18/13.She has been dieting and has lost some weight, 2 pant sizes, is on [place], ate [place] and has had stomach cramps and diarrhea.Periods are short and light and may even skip one, but has cramps 1 day.   Current Medications, Allergies, Past Medical History, Past Surgical History, Family History and Social History were reviewed in [name] record.     Review of Systems: Patient denies any headaches, hearing loss, fatigue, blurred vision, shortness of breath, chest pain, problems with urination, or intercourse. No joint pain or mood swings.See HPI for positives.    Physical Exam:BP 120/60 mmHg  Pulse 84  Ht  (1.651 m)  Wt 193 lb (87.544 kg)  BMI 32.12 kg/m2  LMP 11/11/2014 General:  Well developed, well nourished, no acute distress Skin:  Warm and dry Neck:  Midline trachea, normal thyroid, good ROM, no lymphadenopathy Lungs; Clear to auscultation bilaterally Breast:  No dominant palpable mass, retraction, or nipple discharge Cardiovascular: Regular rate and rhythm Abdomen:  Soft, non tender, no hepatosplenomegaly, has good bowel sounds in all 4 quadrants Pelvic:  External genitalia is normal in appearance, no lesions.  The vagina is normal in appearance. Urethra has no lesions or masses. The cervix is smooth.  Uterus is felt to be normal size, shape, and contour.  No adnexal masses or tenderness noted.Bladder is non tender, no masses felt. Extremities/musculoskeletal:  No swelling or varicosities noted, no clubbing or cyanosis Psych:  No mood changes, alert and cooperative,seems happy   Impression: Well woman gyn exam no pap Contraceptive management ?IBS    Plan: Refilled minastrin x  1 year Rx bentyl 20 mg #40 1 ac and hs prn  with 1 refill Pap and physical in 1 year Try Whole 30, review handout on IBS, call if not better, may refer to GI

## 2014-11-25 NOTE — Patient Instructions (Addendum)
Pap and physical in 1 year Try Whole 30 Irritable Bowel Syndrome Irritable bowel syndrome (IBS) is caused by a disturbance of normal bowel function and is a common digestive disorder. You may also hear this condition called spastic colon, mucous colitis, and irritable colon. There is no cure for IBS. However, symptoms often gradually improve or disappear with a good diet, stress management, and medicine. This condition usually appears in late adolescence or early adulthood. Women develop it twice as often as men. CAUSES  After food has been digested and absorbed in the small intestine, waste material is moved into the large intestine, or colon. In the colon, water and salts are absorbed from the undigested products coming from the small intestine. The remaining residue, or fecal material, is held for elimination. Under normal circumstances, gentle, rhythmic contractions of the bowel walls push the fecal material along the colon toward the rectum. In IBS, however, these contractions are irregular and poorly coordinated. The fecal material is either retained too long, resulting in constipation, or expelled too soon, producing diarrhea. SIGNS AND SYMPTOMS  The most common symptom of IBS is abdominal pain. It is often in the lower left side of the abdomen, but it may occur anywhere in the abdomen. The pain comes from spasms of the bowel muscles happening too much and from the buildup of gas and fecal material in the colon. This pain:  Can range from sharp abdominal cramps to a dull, continuous ache.  Often worsens soon after eating.  Is often relieved by having a bowel movement or passing gas. Abdominal pain is usually accompanied by constipation, but it may also produce diarrhea. The diarrhea often occurs right after a meal or upon waking up in the morning. The stools are often soft, watery, and flecked with mucus. Other symptoms of IBS include:  Bloating.  Loss of  appetite.  Heartburn.  Backache.  Dull pain in the arms or shoulders.  Nausea.  Burping.  Vomiting.  Gas. IBS may also cause symptoms that are unrelated to the digestive system, such as:  Fatigue.  Headaches.  Anxiety.  Shortness of breath.  Trouble concentrating.  Dizziness. These symptoms tend to come and go. DIAGNOSIS  The symptoms of IBS may seem like symptoms of other, more serious digestive disorders. Your health care provider may want to perform tests to exclude these disorders.  TREATMENT Many medicines are available to help correct bowel function or relieve bowel spasms and abdominal pain. Among the medicines available are:  Laxatives for severe constipation and to help restore normal bowel habits.  Specific antidiarrheal medicines to treat severe or lasting diarrhea.  Antispasmodic agents to relieve intestinal cramps. Your health care provider may also decide to treat you with a mild tranquilizer or sedative during unusually stressful periods in your life. Your health care provider may also prescribe antidepressant medicine. The use of this medicine has been shown to reduce pain and other symptoms of IBS. Remember that if any medicine is prescribed for you, you should take it exactly as directed. Make sure your health care provider knows how well it worked for you. HOME CARE INSTRUCTIONS   Take all medicines as directed by your health care provider.  Avoid foods that are high in fat or oils, such as heavy cream, butter, frankfurters, sausage, and other fatty meats.  Avoid foods that make you go to the bathroom, such as fruit, fruit juice, and dairy products.  Cut out carbonated drinks, chewing gum, and "gassy" foods such as beans  and cabbage. This may help relieve bloating and burping.  Eat foods with bran, and drink plenty of liquids with the bran foods. This helps relieve constipation.  Keep track of what foods seem to bring on your symptoms.  Avoid  emotionally charged situations or circumstances that produce anxiety.  Start or continue exercising.  Get plenty of rest and sleep. Document Released: 03/19/2005 Document Revised: 03/24/2013 Document Reviewed: 11/07/2007 [facility] Patient Information 2015 [place], [place]. This information is not intended to replace advice given to you by your health care provider. Make sure you discuss any questions you have with your health care provider.

## 2015-03-15 ENCOUNTER — Telehealth: Payer: Self-pay | Admitting: Adult Health

## 2015-03-15 MED ORDER — FLUCONAZOLE 150 MG PO TABS: ORAL_TABLET | ORAL | Status: DC

## 2015-03-15 NOTE — Telephone Encounter (Signed)
Pt requesting diflucan for yeast infection on antibiotics for throat, will rx diflucan

## 2015-08-10 DIAGNOSIS — B070 Plantar wart: Secondary | ICD-10-CM | POA: Diagnosis not present

## 2015-10-10 DIAGNOSIS — K080 Exfoliation of teeth due to systemic causes: Secondary | ICD-10-CM | POA: Diagnosis not present

## 2015-11-01 ENCOUNTER — Other Ambulatory Visit: Payer: Self-pay | Admitting: Adult Health

## 2015-11-02 DIAGNOSIS — D2372 Other benign neoplasm of skin of left lower limb, including hip: Secondary | ICD-10-CM | POA: Diagnosis not present

## 2015-11-02 DIAGNOSIS — K080 Exfoliation of teeth due to systemic causes: Secondary | ICD-10-CM | POA: Diagnosis not present

## 2015-11-02 DIAGNOSIS — M2011 Hallux valgus (acquired), right foot: Secondary | ICD-10-CM | POA: Diagnosis not present

## 2015-11-02 DIAGNOSIS — D2371 Other benign neoplasm of skin of right lower limb, including hip: Secondary | ICD-10-CM | POA: Diagnosis not present

## 2015-11-02 DIAGNOSIS — M722 Plantar fascial fibromatosis: Secondary | ICD-10-CM | POA: Diagnosis not present

## 2015-11-02 DIAGNOSIS — M2012 Hallux valgus (acquired), left foot: Secondary | ICD-10-CM | POA: Diagnosis not present

## 2015-11-16 DIAGNOSIS — D2372 Other benign neoplasm of skin of left lower limb, including hip: Secondary | ICD-10-CM | POA: Diagnosis not present

## 2015-11-16 DIAGNOSIS — M722 Plantar fascial fibromatosis: Secondary | ICD-10-CM | POA: Diagnosis not present

## 2015-11-16 DIAGNOSIS — D2371 Other benign neoplasm of skin of right lower limb, including hip: Secondary | ICD-10-CM | POA: Diagnosis not present

## 2015-12-20 DIAGNOSIS — M65871 Other synovitis and tenosynovitis, right ankle and foot: Secondary | ICD-10-CM | POA: Diagnosis not present

## 2015-12-20 DIAGNOSIS — M25571 Pain in right ankle and joints of right foot: Secondary | ICD-10-CM | POA: Diagnosis not present

## 2015-12-20 DIAGNOSIS — M722 Plantar fascial fibromatosis: Secondary | ICD-10-CM | POA: Diagnosis not present

## 2015-12-26 ENCOUNTER — Encounter: Payer: Self-pay | Admitting: Adult Health

## 2015-12-26 ENCOUNTER — Other Ambulatory Visit (HOSPITAL_COMMUNITY)
Admission: RE | Admit: 2015-12-26 | Discharge: 2015-12-26 | Disposition: A | Discharge status: Home / Self Care | Payer: Managed Care, Other (non HMO) | Source: Ambulatory Visit | Attending: Obstetrics & Gynecology | Admitting: Obstetrics & Gynecology

## 2015-12-26 ENCOUNTER — Ambulatory Visit (INDEPENDENT_AMBULATORY_CARE_PROVIDER_SITE_OTHER): Payer: Managed Care, Other (non HMO) | Admitting: Adult Health

## 2015-12-26 VITALS — BP 110/60 | HR 84 | Ht 65.0 in | Wt 229.5 lb

## 2015-12-26 DIAGNOSIS — Z01419 Encounter for gynecological examination (general) (routine) without abnormal findings: Secondary | ICD-10-CM

## 2015-12-26 DIAGNOSIS — Z3041 Encounter for surveillance of contraceptive pills: Secondary | ICD-10-CM

## 2015-12-26 MED ORDER — NORETHIN ACE-ETH ESTRAD-FE 1-20 MG-MCG(24) PO CHEW: CHEWABLE_TABLET | ORAL | 12 refills | Status: DC

## 2015-12-26 NOTE — Progress Notes (Signed)
Patient ID: Margaret HalstedKalynn L Pierce, female   DOB: 20-Feb-1991, 25 y.o.   MRN: [id number] History of Present Illness: Margaret FeeKalynn is a 25 year old white female in for well woman gyn exam and pap.[facility] is [facility] [employer].    Current Medications, Allergies, Past Medical History, Past Surgical History, Family History and Social History were reviewed in [name].     Review of Systems:  Patient denies any headaches, hearing loss, fatigue, blurred vision, shortness of breath, chest pain, abdominal pain, problems with bowel movements, urination, or intercourse(occasional discomfort). No joint pain or mood swings.   Physical Exam:BP 110/60 (BP Location: Left Arm, Patient Position: Sitting, Cuff Size: Large)   Pulse 84   Ht 5\' 5"  (1.651 m)   Wt 229 lb 8 oz (104.1 kg)   LMP 12/05/2015 (Approximate)   BMI 38.19 kg/m  General:  Well developed, well nourished, no acute distress Skin:  Warm and dry Neck:  Midline trachea, normal thyroid, good ROM, no lymphadenopathy Lungs; Clear to auscultation bilaterally Breast:  No dominant palpable mass, retraction, or nipple discharge Cardiovascular: Regular rate and rhythm Abdomen:  Soft, non tender, no hepatosplenomegaly Pelvic:  External genitalia is normal in appearance, no lesions.  The vagina is normal in appearance. Urethra has no lesions or masses. The cervix is smooth, pap with reflex HPV performed.  Uterus is felt to be normal size, shape, and contour.  No adnexal masses or tenderness noted.Bladder is non tender, no masses felt. Extremities/musculoskeletal:  No swelling or varicosities noted, no clubbing or cyanosis Psych:  No mood changes, alert and cooperative,seems happy Discussed using extra lubrication and increased foreplay with sex. PHQ 2 score 0.  Impression: 1. Encounter for gynecological examination with Papanicolaou smear of cervix   2. Encounter for surveillance of contraceptive pills       Plan: Refilled minastrin  x 1 year, take 1 daily Physical in 1 year, pap in 3 if normal

## 2015-12-26 NOTE — Patient Instructions (Signed)
Physical in 1 year Pap in 3 if normal 

## 2015-12-28 LAB — CYTOLOGY - PAP

## 2016-03-21 DIAGNOSIS — F902 Attention-deficit hyperactivity disorder, combined type: Secondary | ICD-10-CM | POA: Diagnosis not present

## 2016-04-23 DIAGNOSIS — R070 Pain in throat: Secondary | ICD-10-CM | POA: Diagnosis not present

## 2016-04-23 DIAGNOSIS — Z6839 Body mass index (BMI) 39.0-39.9, adult: Secondary | ICD-10-CM | POA: Diagnosis not present

## 2016-04-23 DIAGNOSIS — Z0000 Encounter for general adult medical examination without abnormal findings: Secondary | ICD-10-CM | POA: Diagnosis not present

## 2016-04-23 DIAGNOSIS — E669 Obesity, unspecified: Secondary | ICD-10-CM | POA: Diagnosis not present

## 2016-04-23 DIAGNOSIS — K080 Exfoliation of teeth due to systemic causes: Secondary | ICD-10-CM | POA: Diagnosis not present

## 2016-05-08 DIAGNOSIS — K080 Exfoliation of teeth due to systemic causes: Secondary | ICD-10-CM | POA: Diagnosis not present

## 2016-05-22 DIAGNOSIS — K080 Exfoliation of teeth due to systemic causes: Secondary | ICD-10-CM | POA: Diagnosis not present

## 2016-06-05 DIAGNOSIS — K080 Exfoliation of teeth due to systemic causes: Secondary | ICD-10-CM | POA: Diagnosis not present

## 2016-07-02 DIAGNOSIS — Z6839 Body mass index (BMI) 39.0-39.9, adult: Secondary | ICD-10-CM | POA: Diagnosis not present

## 2016-07-02 DIAGNOSIS — Z713 Dietary counseling and surveillance: Secondary | ICD-10-CM | POA: Diagnosis not present

## 2016-07-02 DIAGNOSIS — E6609 Other obesity due to excess calories: Secondary | ICD-10-CM | POA: Diagnosis not present

## 2016-08-10 DIAGNOSIS — R070 Pain in throat: Secondary | ICD-10-CM | POA: Diagnosis not present

## 2016-08-13 ENCOUNTER — Telehealth: Payer: Self-pay | Admitting: *Deleted

## 2016-08-13 MED ORDER — FLUCONAZOLE 150 MG PO TABS: ORAL_TABLET | ORAL | 1 refills | Status: DC

## 2016-08-13 NOTE — Telephone Encounter (Signed)
Pt requests refill on diflucan, will do

## 2016-11-13 ENCOUNTER — Ambulatory Visit (INDEPENDENT_AMBULATORY_CARE_PROVIDER_SITE_OTHER): Payer: Managed Care, Other (non HMO) | Admitting: Family Medicine

## 2016-11-13 ENCOUNTER — Encounter: Payer: Self-pay | Admitting: Family Medicine

## 2016-11-13 VITALS — BP 122/80 | Ht 64.5 in | Wt 227.1 lb

## 2016-11-13 DIAGNOSIS — F988 Other specified behavioral and emotional disorders with onset usually occurring in childhood and adolescence: Secondary | ICD-10-CM | POA: Diagnosis not present

## 2016-11-13 MED ORDER — LEVOCETIRIZINE DIHYDROCHLORIDE 5 MG PO TABS: 5.0000 mg | ORAL_TABLET | Freq: Every evening | ORAL | 11 refills | Status: DC

## 2016-11-13 MED ORDER — METHYLPHENIDATE HCL ER (OSM) 36 MG PO TBCR: EXTENDED_RELEASE_TABLET | ORAL | 0 refills | Status: DC

## 2016-11-13 NOTE — Progress Notes (Signed)
   Subjective:    Patient ID: Margaret Pierce, female    DOB: 1990-08-03, 26 y.o.   MRN: [id number]  HPI Patient is here to establish a new Pcp. She was seeing Dr. Dwana MelenaZack Pierce for her ADHD,and is on concerta 36 mg two QD, also on Phentrimine for weight loss prescribed by him as well. She is the wife of our pt Margaret MeekJosh Pierce. Patients had ADHD ever since being a young child is been on medication she finds it helps her focus she denies abusing it She is also suffered with significant obesity she does try to watch how she eats unable to do as much exercise she would like because she works third shift she denies being depressed. Review of Systems Denies chest tightness pressure pain shortness of breath    Objective:   Physical Exam Lungs clear heart regular pulse normal significant obesity noted   Patient denies a diabetes high blood pressure heart disease. Denies abusing medication.    Assessment & Plan:  Patient was counseled watch diet exercise lose weight  ADHD-drug registry was checked. 3 prescriptions written. Follow-up in 3 months. Notes from previous doctor was signed for by the patient

## 2016-12-24 ENCOUNTER — Other Ambulatory Visit: Payer: Self-pay | Admitting: Adult Health

## 2016-12-26 ENCOUNTER — Other Ambulatory Visit: Payer: Managed Care, Other (non HMO) | Admitting: Adult Health

## 2017-01-17 ENCOUNTER — Encounter: Payer: Self-pay | Admitting: Adult Health

## 2017-01-17 ENCOUNTER — Ambulatory Visit (INDEPENDENT_AMBULATORY_CARE_PROVIDER_SITE_OTHER): Payer: Managed Care, Other (non HMO) | Admitting: Adult Health

## 2017-01-17 VITALS — BP 128/80 | HR 88 | Ht 65.25 in | Wt 234.0 lb

## 2017-01-17 DIAGNOSIS — Z01419 Encounter for gynecological examination (general) (routine) without abnormal findings: Secondary | ICD-10-CM | POA: Diagnosis not present

## 2017-01-17 DIAGNOSIS — Z3041 Encounter for surveillance of contraceptive pills: Secondary | ICD-10-CM | POA: Diagnosis not present

## 2017-01-17 DIAGNOSIS — Z3202 Encounter for pregnancy test, result negative: Secondary | ICD-10-CM

## 2017-01-17 LAB — POCT URINE PREGNANCY: PREG TEST UR: NEGATIVE

## 2017-01-17 MED ORDER — NORETHIN ACE-ETH ESTRAD-FE 1-20 MG-MCG(24) PO CAPS
1.0000 | ORAL_CAPSULE | Freq: Every day | ORAL | 3 refills | Status: DC
Start: 2017-01-17 — End: 2017-04-22

## 2017-01-17 MED ORDER — DICYCLOMINE HCL 20 MG PO TABS: 20.0000 mg | ORAL_TABLET | Freq: Three times a day (TID) | ORAL | 3 refills | Status: DC

## 2017-01-17 NOTE — Progress Notes (Signed)
Patient ID: Margaret Pierce, female   DOB: 1990/12/29, 26 y.o.   MRN: [id number] History of Present Illness: Margaret Pierce is a 26 year old white female, married, in for well woman gyn exam,she had normal pap 12/26/15. PCP is [employer].   Current Medications, Allergies, Past Medical History, Past Surgical History, Family History and Social History were reviewed in [name].     Review of Systems: Patient denies any headaches, hearing loss, fatigue, blurred vision, shortness of breath, chest pain, problems with bowel movements, urination, or intercourse. No joint pain or mood swings. +cramps, periods are irregular, but has missed some pills, since getting married     Physical Exam:BP 128/80 (BP Location: Left Arm, Patient Position: Sitting, Cuff Size: Large)   Pulse 88   Ht 5' 5.25" (1.657 m)   Wt 234 lb (106.1 kg)   BMI 38.64 kg/m UPT negative. General:  Well developed, well nourished, no acute distress Skin:  Warm and dry Neck:  Midline trachea, normal thyroid, good ROM, no lymphadenopathy Lungs; Clear to auscultation bilaterally Breast:  No dominant palpable mass, retraction, or nipple discharge Cardiovascular: Regular rate and rhythm Abdomen:  Soft, non tender, no hepatosplenomegaly Pelvic:  External genitalia is normal in appearance, no lesions.  The vagina is normal in appearance. Urethra has no lesions or masses. The cervix is bulbous.  Uterus is felt to be normal size, shape, and contour.  No adnexal masses or tenderness noted.Bladder is non tender, no masses felt. Extremities/musculoskeletal:  No swelling or varicosities noted, no clubbing or cyanosis Psych:  No mood changes, alert and cooperative,seems happy PHQ 2 score 0. She declines trying nuva ring, so will change to 24/4 pill pack on same dose pill.Try to take daily at same time.   Impression:  1. Encounter for well woman exam with routine gynecological exam   2. Encounter for surveillance  of contraceptive pills   3. Pregnancy examination or test, negative result      Plan: Meds ordered this encounter  Medications  . Norethin Ace-Eth Estrad-FE (TAYTULLA) 1-20 MG-MCG(24) CAPS    Sig: Take 1 tablet by mouth daily.    Dispense:  84 capsule    Refill:  3    Order Specific Question:   Supervising Provider    Answer:   Despina HiddenEURE, LUTHER H [[id number]]  . dicyclomine (BENTYL) 20 MG tablet    Sig: Take 1 tablet (20 mg total) by mouth 4 (four) times daily -  before meals and at bedtime.    Dispense:  80 tablet    Refill:  3    Order Specific Question:   Supervising Provider    Answer:   Lazaro ArmsEURE, LUTHER H [[id number]]  Use condoms for first pack F/U in 3 months with me Physical in 1 year

## 2017-01-18 ENCOUNTER — Telehealth: Payer: Self-pay | Admitting: *Deleted

## 2017-01-18 NOTE — Telephone Encounter (Signed)
Spoke with pt. Pt received samples of Taytuella. When she went to the pharmacy to get another med, she also got Junel. JAG wants pt to take Taytuella, not Junel. Pt voiced understanding. JSY

## 2017-02-13 ENCOUNTER — Ambulatory Visit: Payer: Managed Care, Other (non HMO) | Admitting: Family Medicine

## 2017-02-18 ENCOUNTER — Telehealth: Payer: Self-pay | Admitting: Family Medicine

## 2017-02-18 MED ORDER — METHYLPHENIDATE HCL ER (OSM) 36 MG PO TBCR: EXTENDED_RELEASE_TABLET | ORAL | 0 refills | Status: DC

## 2017-02-18 NOTE — Telephone Encounter (Signed)
May have prescription does need to keep follow-up

## 2017-02-18 NOTE — Telephone Encounter (Signed)
Prescription up front for pick up. Patient notified and will keep follow up as scheduled.

## 2017-02-18 NOTE — Telephone Encounter (Signed)
Patient requesting refill on methylphenidate 36 mg 2 tablets a day has appointment schedule for 12/10 for 3 month follow up.

## 2017-03-05 ENCOUNTER — Ambulatory Visit: Payer: Managed Care, Other (non HMO) | Admitting: Family Medicine

## 2017-03-11 ENCOUNTER — Ambulatory Visit: Payer: Managed Care, Other (non HMO) | Admitting: Family Medicine

## 2017-03-13 ENCOUNTER — Other Ambulatory Visit: Payer: Self-pay | Admitting: *Deleted

## 2017-03-13 ENCOUNTER — Telehealth: Payer: Self-pay | Admitting: Family Medicine

## 2017-03-13 MED ORDER — METHYLPHENIDATE HCL ER (OSM) 36 MG PO TBCR: EXTENDED_RELEASE_TABLET | ORAL | 0 refills | Status: DC

## 2017-03-13 NOTE — Telephone Encounter (Signed)
She may have one prescription of this medicine keep follow-up visit

## 2017-03-13 NOTE — Telephone Encounter (Signed)
Pt was scheduled for her 3 month follow up this past Monday and due to the weather was unable to come. Pt is needing a refill. Pt has an appt for 04/11/2016.   methylphenidate 36 MG PO CR tablet

## 2017-03-13 NOTE — Telephone Encounter (Signed)
Pt notified script ready for pick up.

## 2017-04-04 DIAGNOSIS — J209 Acute bronchitis, unspecified: Secondary | ICD-10-CM | POA: Diagnosis not present

## 2017-04-04 DIAGNOSIS — J00 Acute nasopharyngitis [common cold]: Secondary | ICD-10-CM | POA: Diagnosis not present

## 2017-04-04 DIAGNOSIS — Z6841 Body Mass Index (BMI) 40.0 and over, adult: Secondary | ICD-10-CM | POA: Diagnosis not present

## 2017-04-04 DIAGNOSIS — R05 Cough: Secondary | ICD-10-CM | POA: Diagnosis not present

## 2017-04-11 ENCOUNTER — Ambulatory Visit (INDEPENDENT_AMBULATORY_CARE_PROVIDER_SITE_OTHER): Payer: Managed Care, Other (non HMO) | Admitting: Family Medicine

## 2017-04-11 ENCOUNTER — Encounter: Payer: Self-pay | Admitting: Family Medicine

## 2017-04-11 VITALS — BP 122/86 | Ht 65.25 in | Wt 240.0 lb

## 2017-04-11 DIAGNOSIS — J019 Acute sinusitis, unspecified: Secondary | ICD-10-CM | POA: Diagnosis not present

## 2017-04-11 DIAGNOSIS — F988 Other specified behavioral and emotional disorders with onset usually occurring in childhood and adolescence: Secondary | ICD-10-CM | POA: Diagnosis not present

## 2017-04-11 MED ORDER — METHYLPHENIDATE HCL ER (OSM) 36 MG PO TBCR: EXTENDED_RELEASE_TABLET | ORAL | 0 refills | Status: DC

## 2017-04-11 MED ORDER — HYDROCODONE-HOMATROPINE 5-1.5 MG/5ML PO SYRP: 5.0000 mL | ORAL_SOLUTION | ORAL | 0 refills | Status: DC | PRN

## 2017-04-11 MED ORDER — DOXYCYCLINE HYCLATE 100 MG PO TABS: 100.0000 mg | ORAL_TABLET | Freq: Two times a day (BID) | ORAL | 0 refills | Status: DC

## 2017-04-11 NOTE — Progress Notes (Signed)
   Subjective:    Patient ID: Margaret Pierce, female    DOB: Oct 23, 1990, 27 y.o.   MRN: [id number]  HPIADD check up. meds working good. Needs refills.   Seen at urgent care last Thursday. Two days left on zpack, finidhed prednisone. Tessalon not helping with cough. Pt wants hycodan cough syrup. Pt states she has taken in the past without an issue. Codeine is on pt's allergy list.   Patient was seen today for ADD checkup. -weight, vital signs reviewed.  The following items were covered. -Compliance with medication : Good compliance  -Problems with completing homework, paying attention/taking good notes in school: This actually helps her with her job helps her stay focused  -grades: Not applicable  - Eating patterns : Appetite fine no headaches  -sleeping: Sleeping well  -Additional issues or questions: Tolerating medicine well drug registry was checked   Review of Systems  Constitutional: Negative for activity change, appetite change, fatigue and fever.  HENT: Positive for congestion and rhinorrhea. Negative for ear pain.   Eyes: Negative for discharge.  Respiratory: Positive for cough. Negative for shortness of breath and wheezing.   Cardiovascular: Negative for chest pain.  Gastrointestinal: Negative for abdominal pain.  Skin: Negative for color change.  Neurological: Negative for headaches.  Psychiatric/Behavioral: Negative for behavioral problems.  Mild sinus symptoms for the past week head congestion sinus pressure pain discomfort no wheezing or difficulty breathing     Objective:   Physical Exam  Constitutional: She appears well-developed.  HENT:  Head: Normocephalic.  Right Ear: External ear normal.  Left Ear: External ear normal.  Nose: Nose normal.  Mouth/Throat: Oropharynx is clear and moist. No oropharyngeal exudate.  Eyes: Right eye exhibits no discharge. Left eye exhibits no discharge.  Neck: Neck supple. No tracheal deviation present.  Cardiovascular:  Normal rate and normal heart sounds.  No murmur heard. Pulmonary/Chest: Effort normal and breath sounds normal. She has no wheezes. She has no rales.  Lymphadenopathy:    She has no cervical adenopathy.  Skin: Skin is warm and dry.  Nursing note and vitals reviewed.         Assessment & Plan:  Patient was seen today for upper respiratory illness. It is felt that the patient is dealing with sinusitis. Antibiotics were prescribed today. Importance of compliance with medication was discussed. Symptoms should gradually resolve over the course of the next several days. If high fevers, progressive illness, difficulty breathing, worsening condition or failure for symptoms to improve over the next several days then the patient is to follow-up. If any emergent conditions the patient is to follow-up in the emergency department otherwise to follow-up in the office.  The patient was seen today as part of the visit regarding ADD. Medications were reviewed with the patient as well as compliance. Side effects were checked for. Discussion regarding effectiveness was held. Prescriptions were written. Patient reminded to follow-up in approximately 3 months. Behavioral and study issues were addressed.

## 2017-04-22 ENCOUNTER — Telehealth: Payer: Self-pay | Admitting: Adult Health

## 2017-04-22 ENCOUNTER — Ambulatory Visit: Payer: Managed Care, Other (non HMO) | Admitting: Adult Health

## 2017-04-22 MED ORDER — NORETHIN ACE-ETH ESTRAD-FE 1-20 MG-MCG(24) PO CAPS: 1.0000 | ORAL_CAPSULE | Freq: Every day | ORAL | 3 refills | Status: DC

## 2017-04-22 NOTE — Telephone Encounter (Signed)
Pt says she is good with Taytulla, no cramps or irregular bleeding, will cancel appt today, [facility] [place] [place]

## 2017-07-09 ENCOUNTER — Ambulatory Visit: Payer: Managed Care, Other (non HMO) | Admitting: Family Medicine

## 2017-07-15 ENCOUNTER — Encounter: Payer: Self-pay | Admitting: Family Medicine

## 2017-07-15 ENCOUNTER — Ambulatory Visit (INDEPENDENT_AMBULATORY_CARE_PROVIDER_SITE_OTHER): Payer: Managed Care, Other (non HMO) | Admitting: Family Medicine

## 2017-07-15 VITALS — BP 136/84 | Ht 65.25 in | Wt 247.4 lb

## 2017-07-15 DIAGNOSIS — F988 Other specified behavioral and emotional disorders with onset usually occurring in childhood and adolescence: Secondary | ICD-10-CM | POA: Diagnosis not present

## 2017-07-15 DIAGNOSIS — K219 Gastro-esophageal reflux disease without esophagitis: Secondary | ICD-10-CM

## 2017-07-15 MED ORDER — METHYLPHENIDATE HCL ER (OSM) 36 MG PO TBCR: EXTENDED_RELEASE_TABLET | ORAL | 0 refills | Status: DC

## 2017-07-15 MED ORDER — PANTOPRAZOLE SODIUM 40 MG PO TBEC: 40.0000 mg | DELAYED_RELEASE_TABLET | Freq: Every day | ORAL | 5 refills | Status: DC

## 2017-07-15 NOTE — Progress Notes (Signed)
   Subjective:    Patient ID: Margaret Pierce, female    DOB: 07-05-1990, 27 y.o.   MRN: [id number]  HPI Patient was seen today for ADD checkup. -weight, vital signs reviewed.  The following items were covered. 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 Husband takes protonix and pt has tried husbands med and states that it helped a lot better than OTC med.  Patient does relate intermittent reflux issues Zantac does not do enough she gets heartburn but no dysphagia  She is thinking about starting to have a family in the near future she wonders if she needs to be doing anything different  Review of Systems  Constitutional: Negative for activity change, appetite change and fatigue.  HENT: Negative for congestion.   Respiratory: Negative for cough.   Cardiovascular: Negative for chest pain.  Gastrointestinal: Negative for abdominal pain.  Skin: Negative for color change.  Neurological: Negative for headaches.  Psychiatric/Behavioral: Negative for behavioral problems.       Objective:   Physical Exam  Constitutional: She appears well-developed and well-nourished.  HENT:  Head: Normocephalic and atraumatic.  Eyes: Right eye exhibits no discharge. Left eye exhibits no discharge.  Cardiovascular: Normal rate, regular rhythm and normal heart sounds.  No murmur heard. Pulmonary/Chest: Effort normal and breath sounds normal. No respiratory distress. She has no wheezes. She has no rales.  Neurological: She is alert.  Skin: Skin is warm and dry.  Psychiatric: She has a normal mood and affect.  Vitals reviewed.         Assessment & Plan:  Reflux not under good control with  current medication Start Protonix 40 mg daily Advised patient a reflux diet If ongoing troubles or dysphagia may need GI referral In the long run ideally would not want to be on PPI If reflux gets worse may need GI referral Adult ADD The patient was seen today as part of the visit regarding ADD. Medications were reviewed with the patient as well as compliance. Side effects were checked for. Discussion regarding effectiveness was held. Prescriptions were written. Patient reminded to follow-up in approximately 3 months. Behavioral and study issues were addressed.  Patient is thinking about getting pregnant if she gets pregnant she will need to be off of this medicine she is aware of this  Follow-up 3 months  I did advise her to see her gynecologist she will need to go on folic acid on a regular basis to prevent neural tube defects with pregnancy

## 2017-09-27 ENCOUNTER — Ambulatory Visit: Payer: Managed Care, Other (non HMO) | Admitting: Adult Health

## 2017-10-04 ENCOUNTER — Other Ambulatory Visit: Payer: Self-pay

## 2017-10-04 ENCOUNTER — Encounter: Payer: Self-pay | Admitting: Adult Health

## 2017-10-04 ENCOUNTER — Ambulatory Visit (INDEPENDENT_AMBULATORY_CARE_PROVIDER_SITE_OTHER): Payer: Managed Care, Other (non HMO) | Admitting: Adult Health

## 2017-10-04 VITALS — BP 116/73 | HR 85 | Ht 64.0 in | Wt 257.0 lb

## 2017-10-04 DIAGNOSIS — R109 Unspecified abdominal pain: Secondary | ICD-10-CM

## 2017-10-04 DIAGNOSIS — Z3A01 Less than 8 weeks gestation of pregnancy: Secondary | ICD-10-CM

## 2017-10-04 DIAGNOSIS — N926 Irregular menstruation, unspecified: Secondary | ICD-10-CM

## 2017-10-04 DIAGNOSIS — Z3201 Encounter for pregnancy test, result positive: Secondary | ICD-10-CM

## 2017-10-04 DIAGNOSIS — R110 Nausea: Secondary | ICD-10-CM | POA: Diagnosis not present

## 2017-10-04 DIAGNOSIS — O3680X0 Pregnancy with inconclusive fetal viability, not applicable or unspecified: Secondary | ICD-10-CM

## 2017-10-04 DIAGNOSIS — O26899 Other specified pregnancy related conditions, unspecified trimester: Secondary | ICD-10-CM

## 2017-10-04 DIAGNOSIS — Z13220 Encounter for screening for lipoid disorders: Secondary | ICD-10-CM | POA: Insufficient documentation

## 2017-10-04 LAB — POCT URINE PREGNANCY: PREG TEST UR: POSITIVE — AB

## 2017-10-04 MED ORDER — DOXYLAMINE-PYRIDOXINE ER 20-20 MG PO TBCR: 1.0000 | EXTENDED_RELEASE_TABLET | Freq: Two times a day (BID) | ORAL | 0 refills | Status: DC

## 2017-10-04 MED ORDER — PRENATAL PLUS 27-1 MG PO TABS: 1.0000 | ORAL_TABLET | Freq: Every day | ORAL | 12 refills | Status: DC

## 2017-10-04 NOTE — Progress Notes (Signed)
  Subjective:     Patient ID: Margaret Pierce, female   DOB: [birth year], 27 y.o.   MRN: [id number]  HPI Margaret Pierce is a 27 year old white female, married in for UPT, has missed a period and had 5+HPTs.   Review of Systems +missed period with 5+HPTs +nausea +cramping Reviewed past medical,surgical, social and family history. Reviewed medications and allergies.     Objective:   Physical Exam BP 116/73 (BP Location: Right Arm, Patient Position: Sitting, Cuff Size: Large)   Pulse 85   Ht 5\' 4"  (1.626 m)   Wt 257 lb (116.6 kg)   LMP 08/12/2017   BMI 44.11 kg/m UPT +about 7+6 weeks by LMP with EDD 05/19/18.Skin warm and dry. Neck: mid line trachea, normal thyroid, good ROM, no lymphadenopathy noted. Lungs: clear to ausculation bilaterally. Cardiovascular: regular rate and rhythm. Abdomen is soft, no pain.     Assessment:     1. Positive pregnancy test   2. Less than [redacted] weeks gestation of pregnancy   3. Encounter to determine fetal viability of pregnancy, single or unspecified fetus   4. Nausea   5. Cramping affecting pregnancy, antepartum       Plan:     Meds ordered this encounter  Medications  . prenatal vitamin w/FE, FA (PRENATAL 1 + 1) 27-1 MG TABS tablet    Sig: Take 1 tablet by mouth daily at 12 noon.    Dispense:  30 each    Refill:  12    Order Specific Question:   Supervising Provider    Answer:   Despina HiddenEURE, LUTHER H [[id number]]  . Doxylamine-Pyridoxine ER (BONJESTA) 20-20 MG TBCR    Sig: Take 1 tablet by mouth 2 (two) times daily at 8 am and 10 pm.    Dispense:  36 tablet    Refill:  0    Order Specific Question:   Supervising Provider    Answer:   Duane LopeEURE, LUTHER H [[id number]]   Follow up in about 1 week for dating US Review  Handouts on first trimester and by [facility] tree

## 2017-10-04 NOTE — Patient Instructions (Signed)
First Trimester of Pregnancy The first trimester of pregnancy is from week 1 until the end of week 13 (months 1 through 3). A week after a sperm fertilizes an egg, the egg will implant on the wall of the uterus. This embryo will begin to develop into a baby. Genes from you and your partner will form the baby. The female genes will determine whether the baby will be a boy or a girl. At 6-8 weeks, the eyes and face will be formed, and the heartbeat can be seen on ultrasound. At the end of 12 weeks, all the baby's organs will be formed. Now that you are pregnant, you will want to do everything you can to have a healthy baby. Two of the most important things are to get good prenatal care and to follow your health care provider's instructions. Prenatal care is all the medical care you receive before the baby's birth. This care will help prevent, find, and treat any problems during the pregnancy and childbirth. Body changes during your first trimester Your body goes through many changes during pregnancy. The changes vary from woman to woman.  You may gain or lose a couple of pounds at first.  You may feel sick to your stomach (nauseous) and you may throw up (vomit). If the vomiting is uncontrollable, call your health care provider.  You may tire easily.  You may develop headaches that can be relieved by medicines. All medicines should be approved by your health care provider.  You may urinate more often. Painful urination may mean you have a bladder infection.  You may develop heartburn as a result of your pregnancy.  You may develop constipation because certain hormones are causing the muscles that push stool through your intestines to slow down.  You may develop hemorrhoids or swollen veins (varicose veins).  Your breasts may begin to grow larger and become tender. Your nipples may stick out more, and the tissue that surrounds them (areola) may become darker.  Your gums may bleed and may be  sensitive to brushing and flossing.  Dark spots or blotches (chloasma, mask of pregnancy) may develop on your face. This will likely fade after the baby is born.  Your menstrual periods will stop.  You may have a loss of appetite.  You may develop cravings for certain kinds of food.  You may have changes in your emotions from day to day, such as being excited to be pregnant or being concerned that something may go wrong with the pregnancy and baby.  You may have more vivid and strange dreams.  You may have changes in your hair. These can include thickening of your hair, rapid growth, and changes in texture. Some women also have hair loss during or after pregnancy, or hair that feels dry or thin. Your hair will most likely return to normal after your baby is born.  What to expect at prenatal visits During a routine prenatal visit:  You will be weighed to make sure you and the baby are growing normally.  Your blood pressure will be taken.  Your abdomen will be measured to track your baby's growth.  The fetal heartbeat will be listened to between weeks 10 and 14 of your pregnancy.  Test results from any previous visits will be discussed.  Your health care provider may ask you:  How you are feeling.  If you are feeling the baby move.  If you have had any abnormal symptoms, such as leaking fluid, bleeding, severe headaches,   or abdominal cramping.  If you are using any tobacco products, including cigarettes, chewing tobacco, and electronic cigarettes.  If you have any questions.  Other tests that may be performed during your first trimester include:  Blood tests to find your blood type and to check for the presence of any previous infections. The tests will also be used to check for low iron levels (anemia) and protein on red blood cells (Rh antibodies). Depending on your risk factors, or if you previously had diabetes during pregnancy, you may have tests to check for high blood  sugar that affects pregnant women (gestational diabetes).  Urine tests to check for infections, diabetes, or protein in the urine.  An ultrasound to confirm the proper growth and development of the baby.  Fetal screens for spinal cord problems (spina bifida) and Down syndrome.  HIV (human immunodeficiency virus) testing. Routine prenatal testing includes screening for HIV, unless you choose not to have this test.  You may need other tests to make sure you and the baby are doing well.  Follow these instructions at home: Medicines  Follow your health care provider's instructions regarding medicine use. Specific medicines may be either safe or unsafe to take during pregnancy.  Take a prenatal vitamin that contains at least 600 micrograms (mcg) of folic acid.  If you develop constipation, try taking a stool softener if your health care provider approves. Eating and drinking  Eat a balanced diet that includes fresh fruits and vegetables, whole grains, good sources of protein such as meat, eggs, or tofu, and low-fat dairy. Your health care provider will help you determine the amount of weight gain that is right for you.  Avoid raw meat and uncooked cheese. These carry germs that can cause birth defects in the baby.  Eating four or five small meals rather than three large meals a day may help relieve nausea and vomiting. If you start to feel nauseous, eating a few soda crackers can be helpful. Drinking liquids between meals, instead of during meals, also seems to help ease nausea and vomiting.  Limit foods that are high in fat and processed sugars, such as fried and sweet foods.  To prevent constipation: ? Eat foods that are high in fiber, such as fresh fruits and vegetables, whole grains, and beans. ? Drink enough fluid to keep your urine clear or pale yellow. Activity  Exercise only as directed by your health care provider. Most women can continue their usual exercise routine during  pregnancy. Try to exercise for 30 minutes at least 5 days a week. Exercising will help you: ? Control your weight. ? Stay in shape. ? Be prepared for labor and delivery.  Experiencing pain or cramping in the lower abdomen or lower back is a good sign that you should stop exercising. Check with your health care provider before continuing with normal exercises.  Try to avoid standing for long periods of time. Move your legs often if you must stand in one place for a long time.  Avoid heavy lifting.  Wear low-heeled shoes and practice good posture.  You may continue to have sex unless your health care provider tells you not to. Relieving pain and discomfort  Wear a good support bra to relieve breast tenderness.  Take warm sitz baths to soothe any pain or discomfort caused by hemorrhoids. Use hemorrhoid cream if your health care provider approves.  Rest with your legs elevated if you have leg cramps or low back pain.  If you develop   varicose veins in your legs, wear support hose. Elevate your feet for 15 minutes, 3-4 times a day. Limit salt in your diet. Prenatal care  Schedule your prenatal visits by the twelfth week of pregnancy. They are usually scheduled monthly at first, then more often in the last 2 months before delivery.  Write down your questions. Take them to your prenatal visits.  Keep all your prenatal visits as told by your health care provider. This is important. Safety  Wear your seat belt at all times when driving.  Make a list of emergency phone numbers, including numbers for family, friends, the hospital, and police and fire departments. General instructions  Ask your health care provider for a referral to a local prenatal education class. Begin classes no later than the beginning of month 6 of your pregnancy.  Ask for help if you have counseling or nutritional needs during pregnancy. Your health care provider can offer advice or refer you to specialists for help  with various needs.  Do not use hot tubs, steam rooms, or saunas.  Do not douche or use tampons or scented sanitary pads.  Do not cross your legs for long periods of time.  Avoid cat litter boxes and soil used by cats. These carry germs that can cause birth defects in the baby and possibly loss of the fetus by miscarriage or stillbirth.  Avoid all smoking, herbs, alcohol, and medicines not prescribed by your health care provider. Chemicals in these products affect the formation and growth of the baby.  Do not use any products that contain nicotine or tobacco, such as cigarettes and e-cigarettes. If you need help quitting, ask your health care provider. You may receive counseling support and other resources to help you quit.  Schedule a dentist appointment. At home, brush your teeth with a soft toothbrush and be gentle when you floss. Contact a health care provider if:  You have dizziness.  You have mild pelvic cramps, pelvic pressure, or nagging pain in the abdominal area.  You have persistent nausea, vomiting, or diarrhea.  You have a bad smelling vaginal discharge.  You have pain when you urinate.  You notice increased swelling in your face, hands, legs, or ankles.  You are exposed to fifth disease or chickenpox.  You are exposed to German measles (rubella) and have never had it. Get help right away if:  You have a fever.  You are leaking fluid from your vagina.  You have spotting or bleeding from your vagina.  You have severe abdominal cramping or pain.  You have rapid weight gain or loss.  You vomit blood or material that looks like coffee grounds.  You develop a severe headache.  You have shortness of breath.  You have any kind of trauma, such as from a fall or a car accident. Summary  The first trimester of pregnancy is from week 1 until the end of week 13 (months 1 through 3).  Your body goes through many changes during pregnancy. The changes vary from  woman to woman.  You will have routine prenatal visits. During those visits, your health care provider will examine you, discuss any test results you may have, and talk with you about how you are feeling. This information is not intended to replace advice given to you by your health care provider. Make sure you discuss any questions you have with your health care provider. Document Released: 03/13/2001 Document Revised: 02/29/2016 Document Reviewed: 02/29/2016 Elsevier Interactive Patient Education  2018 [employer]  

## 2017-10-11 ENCOUNTER — Encounter: Payer: Managed Care, Other (non HMO) | Admitting: Family Medicine

## 2017-10-15 ENCOUNTER — Ambulatory Visit (INDEPENDENT_AMBULATORY_CARE_PROVIDER_SITE_OTHER): Payer: Managed Care, Other (non HMO)

## 2017-10-15 DIAGNOSIS — Z3A09 9 weeks gestation of pregnancy: Secondary | ICD-10-CM

## 2017-10-15 DIAGNOSIS — O3680X0 Pregnancy with inconclusive fetal viability, not applicable or unspecified: Secondary | ICD-10-CM | POA: Diagnosis not present

## 2017-10-15 NOTE — Progress Notes (Signed)
US 7+3 wks,single IUP w/ys,positive fht 144 bpm,normal ovaries bilat,crl 12.21 mm

## 2017-11-01 ENCOUNTER — Encounter: Payer: Self-pay | Admitting: Women's Health

## 2017-11-01 ENCOUNTER — Ambulatory Visit (INDEPENDENT_AMBULATORY_CARE_PROVIDER_SITE_OTHER): Payer: Managed Care, Other (non HMO) | Admitting: Women's Health

## 2017-11-01 VITALS — BP 111/63 | HR 91 | Wt 260.0 lb

## 2017-11-01 DIAGNOSIS — Z3A09 9 weeks gestation of pregnancy: Secondary | ICD-10-CM

## 2017-11-01 DIAGNOSIS — Z3401 Encounter for supervision of normal first pregnancy, first trimester: Secondary | ICD-10-CM

## 2017-11-01 DIAGNOSIS — Z331 Pregnant state, incidental: Secondary | ICD-10-CM

## 2017-11-01 DIAGNOSIS — Z1389 Encounter for screening for other disorder: Secondary | ICD-10-CM

## 2017-11-01 DIAGNOSIS — Z3400 Encounter for supervision of normal first pregnancy, unspecified trimester: Secondary | ICD-10-CM | POA: Insufficient documentation

## 2017-11-01 LAB — POCT URINALYSIS DIPSTICK OB
Blood, UA: NEGATIVE
Glucose, UA: NEGATIVE — AB
KETONES UA: NEGATIVE
Leukocytes, UA: NEGATIVE
Nitrite, UA: NEGATIVE
PROTEIN: NEGATIVE

## 2017-11-01 NOTE — Progress Notes (Signed)
INITIAL OBSTETRICAL VISIT Patient name: Margaret Pierce MRN [id number]  Date of birth: [birth year] Chief Complaint:   Initial Prenatal Visit  History of Present Illness:   Margaret Pierce is a 27 y.o. G1P0 Caucasian female at 6758w6d by 7wk u/s, with an Estimated Date of Delivery: 05/31/18 being seen today for her initial obstetrical visit.   Her obstetrical history is significant for primigravida.   Today she reports n/v, has [place], takes at night b/c makes her so sleepy- unable to take during day. Ptyalism- declines Robinul.  Patient's last menstrual period was 08/12/2017. Last pap 12/26/15. Results were: normal Review of Systems:   Pertinent items are noted in HPI Denies cramping/contractions, leakage of fluid, vaginal bleeding, abnormal vaginal discharge w/ itching/odor/irritation, headaches, visual changes, shortness of breath, chest pain, abdominal pain, severe nausea/vomiting, or problems with urination or bowel movements unless otherwise stated above.  Pertinent History Reviewed:  Reviewed past medical,surgical, social, obstetrical and family history.  Reviewed problem list, medications and allergies. OB History  Gravida Para Term Preterm AB Living  1            SAB TAB Ectopic Multiple Live Births               # Outcome Date GA Lbr Len/2nd Weight Sex Delivery Anes PTL Lv  1 Current            Physical Assessment:   Vitals:   11/01/17 1312  BP: 111/63  Pulse: 91  Weight: 260 lb (117.9 kg)  Body mass index is 44.63 kg/m.       Physical Examination:  General appearance - well appearing, and in no distress  Mental status - alert, oriented to person, place, and time  Psych:  She has a normal mood and affect  Skin - warm and dry, normal color, no suspicious lesions noted  Chest - effort normal, all lung fields clear to auscultation bilaterally  Heart - normal rate and regular rhythm  Abdomen - soft, nontender  Extremities:  No swelling or varicosities noted  Thin prep  pap is not done  Fetal Heart Rate (bpm): +u/s via informal transabdominal u/s  Results for orders placed or performed in visit on 11/01/17 (from the past 24 hour(s))  POC Urinalysis Dipstick OB   Collection Time: 11/01/17  1:17 PM  Result Value Ref Range   Color, UA     Clarity, UA     Glucose, UA Negative (A) (none)   Bilirubin, UA     Ketones, UA neg    Spec Grav, UA  1.010 - 1.025   Blood, UA neg    pH, UA  5.0 - 8.0   POC Protein UA Negative Negative, Trace   Urobilinogen, UA  0.2 or 1.0 E.U./dL   Nitrite, UA neg    Leukocytes, UA Negative Negative   Appearance     Odor      Assessment & Plan:  1) Low-Risk Pregnancy G1P0 at 8658w6d with an Estimated Date of Delivery: 05/31/18   2) Initial OB visit  3) N/V> can try VitB6 during the day  4) Ptyalism> let us know if decides for Robinul  Meds: No orders of the defined types were placed in this encounter.   Initial labs obtained Continue prenatal vitamins Reviewed n/v relief measures and warning s/s to report Reviewed recommended weight gain based on pre-gravid BMI Encouraged well-balanced diet Genetic Screening discussed Integrated Screen: declined Cystic fibrosis screening discussed declined Ultrasound discussed; fetal survey: requested CCNC  completed>not applying for preg mcaid  Follow-up: Return in about 1 month (around 11/29/2017) for LROB.   Orders Placed This Encounter  Procedures  . GC/Chlamydia Probe Amp  . Urine Culture  . Urinalysis, Routine w reflex microscopic  . Obstetric Panel, Including HIV  . Pain Management Screening Profile (10S)  . POC Urinalysis Dipstick OB    Cheral Marker CNM, [facility] 11/01/2017 1:52 PM

## 2017-11-01 NOTE — Patient Instructions (Addendum)
Margaret Pierce, I greatly value your feedback.  If you receive a survey following your visit with [place] today, we appreciate you taking the time to fill it out.  Thanks, Joellyn Haff, CNM, WHNP-BC  Vitamin B6 (pyridoxine) 25mg  four times a day OR 50mg  twice a day   Nausea & Vomiting  Have saltine crackers or pretzels by your bed and eat a few bites before you raise your head out of bed in the morning  Eat small frequent meals throughout the day instead of large meals  Drink plenty of fluids throughout the day to stay hydrated, just don't drink a lot of fluids with your meals.  This can make your stomach fill up faster making you feel sick  Do not brush your teeth right after you eat  Products with real ginger are good for nausea, like ginger ale and ginger hard candy Make sure it says made with real ginger!  Sucking on sour candy like lemon heads is also good for nausea  If your prenatal vitamins make you nauseated, take them at night so you will sleep through the nausea  Sea Bands  If you feel like you need medicine for the nausea & vomiting please let us know  If you are unable to keep any fluids or food down please let us know   Constipation  Drink plenty of fluid, preferably water, throughout the day  Eat foods high in fiber such as fruits, vegetables, and grains  Exercise, such as walking, is a good way to keep your bowels regular  Drink warm fluids, especially warm prune juice, or decaf coffee  Eat a 1/2 cup of real oatmeal (not instant), 1/2 cup applesauce, and 1/2-1 cup warm prune juice every day  If needed, you may take Colace (docusate sodium) stool softener once or twice a day to help keep the stool soft. If you are pregnant, wait until you are out of your first trimester (12-14 weeks of pregnancy)  If you still are having problems with constipation, you may take Miralax once daily as needed to help keep your bowels regular.  If you are pregnant, wait until you are out  of your first trimester (12-14 weeks of pregnancy)   First Trimester of Pregnancy The first trimester of pregnancy is from week 1 until the end of week 12 (months 1 through 3). A week after a sperm fertilizes an egg, the egg will implant on the wall of the uterus. This embryo will begin to develop into a baby. Genes from you and your partner are forming the baby. The female genes determine whether the baby is a boy or a girl. At 6-8 weeks, the eyes and face are formed, and the heartbeat can be seen on ultrasound. At the end of 12 weeks, all the baby's organs are formed.  Now that you are pregnant, you will want to do everything you can to have a healthy baby. Two of the most important things are to get good prenatal care and to follow your health care provider's instructions. Prenatal care is all the medical care you receive before the baby's birth. This care will help prevent, find, and treat any problems during the pregnancy and childbirth. BODY CHANGES Your body goes through many changes during pregnancy. The changes vary from woman to woman.   You may gain or lose a couple of pounds at first.  You may feel sick to your stomach (nauseous) and throw up (vomit). If the vomiting is uncontrollable, call  your health care provider.  You may tire easily.  You may develop headaches that can be relieved by medicines approved by your health care provider.  You may urinate more often. Painful urination may mean you have a bladder infection.  You may develop heartburn as a result of your pregnancy.  You may develop constipation because certain hormones are causing the muscles that push waste through your intestines to slow down.  You may develop hemorrhoids or swollen, bulging veins (varicose veins).  Your breasts may begin to grow larger and become tender. Your nipples may stick out more, and the tissue that surrounds them (areola) may become darker.  Your gums may bleed and may be sensitive to  brushing and flossing.  Dark spots or blotches (chloasma, mask of pregnancy) may develop on your face. This will likely fade after the baby is born.  Your menstrual periods will stop.  You may have a loss of appetite.  You may develop cravings for certain kinds of food.  You may have changes in your emotions from day to day, such as being excited to be pregnant or being concerned that something may go wrong with the pregnancy and baby.  You may have more vivid and strange dreams.  You may have changes in your hair. These can include thickening of your hair, rapid growth, and changes in texture. Some women also have hair loss during or after pregnancy, or hair that feels dry or thin. Your hair will most likely return to normal after your baby is born. WHAT TO EXPECT AT YOUR PRENATAL VISITS During a routine prenatal visit:  You will be weighed to make sure you and the baby are growing normally.  Your blood pressure will be taken.  Your abdomen will be measured to track your baby's growth.  The fetal heartbeat will be listened to starting around week 10 or 12 of your pregnancy.  Test results from any previous visits will be discussed. Your health care provider may ask you:  How you are feeling.  If you are feeling the baby move.  If you have had any abnormal symptoms, such as leaking fluid, bleeding, severe headaches, or abdominal cramping.  If you have any questions. Other tests that may be performed during your first trimester include:  Blood tests to find your blood type and to check for the presence of any previous infections. They will also be used to check for low iron levels (anemia) and Rh antibodies. Later in the pregnancy, blood tests for diabetes will be done along with other tests if problems develop.  Urine tests to check for infections, diabetes, or protein in the urine.  An ultrasound to confirm the proper growth and development of the baby.  An amniocentesis  to check for possible genetic problems.  Fetal screens for spina bifida and Down syndrome.  You may need other tests to make sure you and the baby are doing well. HOME CARE INSTRUCTIONS  Medicines  Follow your health care provider's instructions regarding medicine use. Specific medicines may be either safe or unsafe to take during pregnancy.  Take your prenatal vitamins as directed.  If you develop constipation, try taking a stool softener if your health care provider approves. Diet  Eat regular, well-balanced meals. Choose a variety of foods, such as meat or vegetable-based protein, fish, milk and low-fat dairy products, vegetables, fruits, and whole grain breads and cereals. Your health care provider will help you determine the amount of weight gain that is right  for you.  Avoid raw meat and uncooked cheese. These carry germs that can cause birth defects in the baby.  Eating four or five small meals rather than three large meals a day may help relieve nausea and vomiting. If you start to feel nauseous, eating a few soda crackers can be helpful. Drinking liquids between meals instead of during meals also seems to help nausea and vomiting.  If you develop constipation, eat more high-fiber foods, such as fresh vegetables or fruit and whole grains. Drink enough fluids to keep your urine clear or pale yellow. Activity and Exercise  Exercise only as directed by your health care provider. Exercising will help you:  Control your weight.  Stay in shape.  Be prepared for labor and delivery.  Experiencing pain or cramping in the lower abdomen or low back is a good sign that you should stop exercising. Check with your health care provider before continuing normal exercises.  Try to avoid standing for long periods of time. Move your legs often if you must stand in one place for a long time.  Avoid heavy lifting.  Wear low-heeled shoes, and practice good posture.  You may continue to have  sex unless your health care provider directs you otherwise. Relief of Pain or Discomfort  Wear a good support bra for breast tenderness.   Take warm sitz baths to soothe any pain or discomfort caused by hemorrhoids. Use hemorrhoid cream if your health care provider approves.   Rest with your legs elevated if you have leg cramps or low back pain.  If you develop varicose veins in your legs, wear support hose. Elevate your feet for 15 minutes, 3-4 times a day. Limit salt in your diet. Prenatal Care  Schedule your prenatal visits by the twelfth week of pregnancy. They are usually scheduled monthly at first, then more often in the last 2 months before delivery.  Write down your questions. Take them to your prenatal visits.  Keep all your prenatal visits as directed by your health care provider. Safety  Wear your seat belt at all times when driving.  Make a list of emergency phone numbers, including numbers for family, friends, the hospital, and police and fire departments. General Tips  Ask your health care provider for a referral to a local prenatal education class. Begin classes no later than at the beginning of month 6 of your pregnancy.  Ask for help if you have counseling or nutritional needs during pregnancy. Your health care provider can offer advice or refer you to specialists for help with various needs.  Do not use hot tubs, steam rooms, or saunas.  Do not douche or use tampons or scented sanitary pads.  Do not cross your legs for long periods of time.  Avoid cat litter boxes and soil used by cats. These carry germs that can cause birth defects in the baby and possibly loss of the fetus by miscarriage or stillbirth.  Avoid all smoking, herbs, alcohol, and medicines not prescribed by your health care provider. Chemicals in these affect the formation and growth of the baby.  Schedule a dentist appointment. At home, brush your teeth with a soft toothbrush and be gentle when  you floss. SEEK MEDICAL CARE IF:   You have dizziness.  You have mild pelvic cramps, pelvic pressure, or nagging pain in the abdominal area.  You have persistent nausea, vomiting, or diarrhea.  You have a bad smelling vaginal discharge.  You have pain with urination.  You notice  increased swelling in your face, hands, legs, or ankles. SEEK IMMEDIATE MEDICAL CARE IF:   You have a fever.  You are leaking fluid from your vagina.  You have spotting or bleeding from your vagina.  You have severe abdominal cramping or pain.  You have rapid weight gain or loss.  You vomit blood or material that looks like coffee grounds.  You are exposed to [place] measles and have never had them.  You are exposed to fifth disease or chickenpox.  You develop a severe headache.  You have shortness of breath.  You have any kind of trauma, such as from a fall or a car accident. Document Released: 03/13/2001 Document Revised: 08/03/2013 Document Reviewed: 01/27/2013 [facility] Patient Information 2015 [place] [place]. This information is not intended to replace advice given to you by your health care provider. Make sure you discuss any questions you have with your health care provider.

## 2017-11-02 LAB — OBSTETRIC PANEL, INCLUDING HIV
ANTIBODY SCREEN: NEGATIVE
BASOS ABS: 0 10*3/uL (ref 0.0–0.2)
Basos: 0 %
EOS (ABSOLUTE): 0.2 10*3/uL (ref 0.0–0.4)
Eos: 2 %
HEMOGLOBIN: 13.5 g/dL (ref 11.1–15.9)
HIV SCREEN 4TH GENERATION: NONREACTIVE
Hematocrit: 42.5 % (ref 34.0–46.6)
Hepatitis B Surface Ag: NEGATIVE
IMMATURE GRANULOCYTES: 0 %
Immature Grans (Abs): 0 10*3/uL (ref 0.0–0.1)
Lymphocytes Absolute: 2.3 10*3/uL (ref 0.7–3.1)
Lymphs: 22 %
MCH: 30.1 pg (ref 26.6–33.0)
MCHC: 31.8 g/dL (ref 31.5–35.7)
MCV: 95 fL (ref 79–97)
Monocytes Absolute: 0.7 10*3/uL (ref 0.1–0.9)
Monocytes: 6 %
NEUTROS PCT: 70 %
Neutrophils Absolute: 7.2 10*3/uL — ABNORMAL HIGH (ref 1.4–7.0)
Platelets: 282 10*3/uL (ref 150–450)
RBC: 4.48 x10E6/uL (ref 3.77–5.28)
RDW: 12.8 % (ref 12.3–15.4)
RPR Ser Ql: NONREACTIVE
Rh Factor: POSITIVE
Rubella Antibodies, IGG: 3.6 index (ref >0.99)
WBC: 10.5 10*3/uL (ref 3.4–10.8)

## 2017-11-02 LAB — URINALYSIS, ROUTINE W REFLEX MICROSCOPIC
Bilirubin, UA: NEGATIVE
GLUCOSE, UA: NEGATIVE
Ketones, UA: NEGATIVE
Leukocytes, UA: NEGATIVE
Nitrite, UA: NEGATIVE
PROTEIN UA: NEGATIVE
RBC, UA: NEGATIVE
Specific Gravity, UA: 1.024 (ref 1.005–1.030)
Urobilinogen, Ur: 0.2 mg/dL (ref 0.2–1.0)
pH, UA: 6.5 (ref 5.0–7.5)

## 2017-11-28 ENCOUNTER — Ambulatory Visit (INDEPENDENT_AMBULATORY_CARE_PROVIDER_SITE_OTHER): Payer: Managed Care, Other (non HMO) | Admitting: Advanced Practice Midwife

## 2017-11-28 ENCOUNTER — Encounter: Payer: Self-pay | Admitting: Advanced Practice Midwife

## 2017-11-28 VITALS — BP 99/64 | HR 81 | Wt 258.0 lb

## 2017-11-28 DIAGNOSIS — Z3A13 13 weeks gestation of pregnancy: Secondary | ICD-10-CM

## 2017-11-28 DIAGNOSIS — Z3401 Encounter for supervision of normal first pregnancy, first trimester: Secondary | ICD-10-CM | POA: Diagnosis not present

## 2017-11-28 DIAGNOSIS — Z331 Pregnant state, incidental: Secondary | ICD-10-CM

## 2017-11-28 DIAGNOSIS — Z363 Encounter for antenatal screening for malformations: Secondary | ICD-10-CM

## 2017-11-28 DIAGNOSIS — Z1389 Encounter for screening for other disorder: Secondary | ICD-10-CM

## 2017-11-28 LAB — POCT URINALYSIS DIPSTICK OB
Blood, UA: NEGATIVE
GLUCOSE, UA: NEGATIVE
Ketones, UA: NEGATIVE
Leukocytes, UA: NEGATIVE
Nitrite, UA: NEGATIVE
POC,PROTEIN,UA: NEGATIVE

## 2017-11-28 NOTE — Progress Notes (Signed)
  [id number] Estimated Date of Delivery: 05/31/18  Blood pressure 99/64, pulse 81, weight 258 lb (117 kg), last menstrual period 08/12/2017.   BP weight and urine results all reviewed and noted.  Please refer to the obstetrical flow sheet for the fundal height and fetal heart rate documentation:  Patient denies any bleeding and no rupture of membranes symptoms or regular contractions. Patient is without complaints. Feeling better All questions were answered.   Physical Assessment:   Vitals:   11/28/17 1146  BP: 99/64  Pulse: 81  Weight: 258 lb (117 kg)  Body mass index is 44.29 kg/m.        Physical Examination:   General appearance: Well appearing, and in no distress  Mental status: Alert, oriented to person, place, and time  Skin: Warm & dry  Cardiovascular: Normal heart rate noted  Respiratory: Normal respiratory effort, no distress  Abdomen: Soft, gravid, nontender  Pelvic: Cervical exam deferred         Extremities: Edema: Trace  Fetal Status: Fetal Heart Rate (bpm): 152   Movement: Absent    Results for orders placed or performed in visit on 11/28/17 (from the past 24 hour(s))  POC Urinalysis Dipstick OB   Collection Time: 11/28/17 11:50 AM  Result Value Ref Range   Color, UA     Clarity, UA     Glucose, UA Negative Negative   Bilirubin, UA     Ketones, UA neg    Spec Grav, UA     Blood, UA neg    pH, UA     POC Protein UA Negative Negative, Trace   Urobilinogen, UA     Nitrite, UA neg    Leukocytes, UA Negative Negative   Appearance     Odor       Orders Placed This Encounter  Procedures  . Urine Culture  . GC/Chlamydia Probe Amp  . US OB Comp + 14 Wk  . Pain Management Screening Profile (10S)  . POC Urinalysis Dipstick OB    Plan:  Continued routine obstetrical care,   Return in about 1 month (around 12/31/2017) for LROB, SA:[id number]:Anatomy.

## 2017-11-28 NOTE — Patient Instructions (Addendum)
Margaret Pierce, I greatly value your feedback.  If you receive a survey following your visit with [place] today, we appreciate you taking the time to fill it out.  Thanks, Cathie Beams, CNM     Second Trimester of Pregnancy The second trimester is from week 14 through week 27 (months 4 through 6). The second trimester is often a time when you feel your best. Your body has adjusted to being pregnant, and you begin to feel better physically. Usually, morning sickness has lessened or quit completely, you may have more energy, and you may have an increase in appetite. The second trimester is also a time when the fetus is growing rapidly. At the end of the sixth month, the fetus is about 9 inches long and weighs about 1 pounds. You will likely begin to feel the baby move (quickening) between 16 and 20 weeks of pregnancy. Body changes during your second trimester Your body continues to go through many changes during your second trimester. The changes vary from woman to woman.  Your weight will continue to increase. You will notice your lower abdomen bulging out.  You may begin to get stretch marks on your hips, abdomen, and breasts.  You may develop headaches that can be relieved by medicines. The medicines should be approved by your health care provider.  You may urinate more often because the fetus is pressing on your bladder.  You may develop or continue to have heartburn as a result of your pregnancy.  You may develop constipation because certain hormones are causing the muscles that push waste through your intestines to slow down.  You may develop hemorrhoids or swollen, bulging veins (varicose veins).  You may have back pain. This is caused by: ? Weight gain. ? Pregnancy hormones that are relaxing the joints in your pelvis. ? A shift in weight and the muscles that support your balance.  Your breasts will continue to grow and they will continue to become tender.  Your gums may  bleed and may be sensitive to brushing and flossing.  Dark spots or blotches (chloasma, mask of pregnancy) may develop on your face. This will likely fade after the baby is born.  A dark line from your belly button to the pubic area (linea nigra) may appear. This will likely fade after the baby is born.  You may have changes in your hair. These can include thickening of your hair, rapid growth, and changes in texture. Some women also have hair loss during or after pregnancy, or hair that feels dry or thin. Your hair will most likely return to normal after your baby is born.  What to expect at prenatal visits During a routine prenatal visit:  You will be weighed to make sure you and the fetus are growing normally.  Your blood pressure will be taken.  Your abdomen will be measured to track your baby's growth.  The fetal heartbeat will be listened to.  Any test results from the previous visit will be discussed.  Your health care provider may ask you:  How you are feeling.  If you are feeling the baby move.  If you have had any abnormal symptoms, such as leaking fluid, bleeding, severe headaches, or abdominal cramping.  If you are using any tobacco products, including cigarettes, chewing tobacco, and electronic cigarettes.  If you have any questions.  Other tests that may be performed during your second trimester include:  Blood tests that check for: ? Low iron levels (anemia). ?  High blood sugar that affects pregnant women (gestational diabetes) between 20 and 28 weeks. ? Rh antibodies. This is to check for a protein on red blood cells (Rh factor).  Urine tests to check for infections, diabetes, or protein in the urine.  An ultrasound to confirm the proper growth and development of the baby.  An amniocentesis to check for possible genetic problems.  Fetal screens for spina bifida and Down syndrome.  HIV (human immunodeficiency virus) testing. Routine prenatal testing  includes screening for HIV, unless you choose not to have this test.  Follow these instructions at home: Medicines  Follow your health care provider's instructions regarding medicine use. Specific medicines may be either safe or unsafe to take during pregnancy.  Take a prenatal vitamin that contains at least 600 micrograms (mcg) of folic acid.  If you develop constipation, try taking a stool softener if your health care provider approves. Eating and drinking  Eat a balanced diet that includes fresh fruits and vegetables, whole grains, good sources of protein such as meat, eggs, or tofu, and low-fat dairy. Your health care provider will help you determine the amount of weight gain that is right for you.  Avoid raw meat and uncooked cheese. These carry germs that can cause birth defects in the baby.  If you have low calcium intake from food, talk to your health care provider about whether you should take a daily calcium supplement.  Limit foods that are high in fat and processed sugars, such as fried and sweet foods.  To prevent constipation: ? Drink enough fluid to keep your urine clear or pale yellow. ? Eat foods that are high in fiber, such as fresh fruits and vegetables, whole grains, and beans. Activity  Exercise only as directed by your health care provider. Most women can continue their usual exercise routine during pregnancy. Try to exercise for 30 minutes at least 5 days a week. Stop exercising if you experience uterine contractions.  Avoid heavy lifting, wear low heel shoes, and practice good posture.  A sexual relationship may be continued unless your health care provider directs you otherwise. Relieving pain and discomfort  Wear a good support bra to prevent discomfort from breast tenderness.  Take warm sitz baths to soothe any pain or discomfort caused by hemorrhoids. Use hemorrhoid cream if your health care provider approves.  Rest with your legs elevated if you have  leg cramps or low back pain.  If you develop varicose veins, wear support hose. Elevate your feet for 15 minutes, 3-4 times a day. Limit salt in your diet. Prenatal Care  Write down your questions. Take them to your prenatal visits.  Keep all your prenatal visits as told by your health care provider. This is important. Safety  Wear your seat belt at all times when driving.  Make a list of emergency phone numbers, including numbers for family, friends, the hospital, and police and fire departments. General instructions  Ask your health care provider for a referral to a local prenatal education class. Begin classes no later than the beginning of month 6 of your pregnancy.  Ask for help if you have counseling or nutritional needs during pregnancy. Your health care provider can offer advice or refer you to specialists for help with various needs.  Do not use hot tubs, steam rooms, or saunas.  Do not douche or use tampons or scented sanitary pads.  Do not cross your legs for long periods of time.  Avoid cat litter boxes and  soil used by cats. These carry germs that can cause birth defects in the baby and possibly loss of the fetus by miscarriage or stillbirth.  Avoid all smoking, herbs, alcohol, and unprescribed drugs. Chemicals in these products can affect the formation and growth of the baby.  Do not use any products that contain nicotine or tobacco, such as cigarettes and e-cigarettes. If you need help quitting, ask your health care provider.  Visit your dentist if you have not gone yet during your pregnancy. Use a soft toothbrush to brush your teeth and be gentle when you floss. Contact a health care provider if:  You have dizziness.  You have mild pelvic cramps, pelvic pressure, or nagging pain in the abdominal area.  You have persistent nausea, vomiting, or diarrhea.  You have a bad smelling vaginal discharge.  You have pain when you urinate. Get help right away if:  You  have a fever.  You are leaking fluid from your vagina.  You have spotting or bleeding from your vagina.  You have severe abdominal cramping or pain.  You have rapid weight gain or weight loss.  You have shortness of breath with chest pain.  You notice sudden or extreme swelling of your face, hands, ankles, feet, or legs.  You have not felt your baby move in over an hour.  You have severe headaches that do not go away when you take medicine.  You have vision changes. Summary  The second trimester is from week 14 through week 27 (months 4 through 6). It is also a time when the fetus is growing rapidly.  Your body goes through many changes during pregnancy. The changes vary from woman to woman.  Avoid all smoking, herbs, alcohol, and unprescribed drugs. These chemicals affect the formation and growth your baby.  Do not use any tobacco products, such as cigarettes, chewing tobacco, and e-cigarettes. If you need help quitting, ask your health care provider.  Contact your health care provider if you have any questions. Keep all prenatal visits as told by your health care provider. This is important. This information is not intended to replace advice given to you by your health care provider. Make sure you discuss any questions you have with your health care provider.   Kinesiology taping for pregnancy:  Youtube has good vidoes of "how tos" for lower back, pelvic, hip pain; swelling of feet, etc       CHILDBIRTH CLASSES ([phone]) [phone] is the phone number for Pregnancy Classes or hospital tours [facility].   You will be referred to  [email or web address] for more information on childbirth classes  At this site you may register for classes. You may sign up for a waiting list if classes are full. Please SIGN UP FOR THIS!.   When the waiting list becomes long, sometimes new classes can be  added.

## 2017-11-29 LAB — PMP SCREEN PROFILE (10S), URINE
AMPHETAMINE SCREEN URINE: NEGATIVE ng/mL
BARBITURATE SCREEN URINE: NEGATIVE ng/mL
BENZODIAZEPINE SCREEN, URINE: NEGATIVE ng/mL
CANNABINOIDS UR QL SCN: NEGATIVE ng/mL
COCAINE(METAB.)SCREEN, URINE: NEGATIVE ng/mL
Creatinine(Crt), U: 124.7 mg/dL (ref 20.0–300.0)
METHADONE SCREEN, URINE: NEGATIVE ng/mL
OPIATE SCREEN URINE: NEGATIVE ng/mL
OXYCODONE+OXYMORPHONE UR QL SCN: NEGATIVE ng/mL
PHENCYCLIDINE QUANTITATIVE URINE: NEGATIVE ng/mL
PROPOXYPHENE SCREEN URINE: NEGATIVE ng/mL
Ph of Urine: 7.1 (ref 4.5–8.9)

## 2017-11-29 LAB — MED LIST OPTION NOT SELECTED

## 2017-11-30 LAB — GC/CHLAMYDIA PROBE AMP
CHLAMYDIA, DNA PROBE: NEGATIVE
NEISSERIA GONORRHOEAE BY PCR: NEGATIVE

## 2017-11-30 LAB — URINE CULTURE: ORGANISM ID, BACTERIA: NO GROWTH

## 2017-12-31 ENCOUNTER — Ambulatory Visit (INDEPENDENT_AMBULATORY_CARE_PROVIDER_SITE_OTHER): Payer: Managed Care, Other (non HMO) | Admitting: Women's Health

## 2017-12-31 ENCOUNTER — Encounter: Payer: Self-pay | Admitting: Women's Health

## 2017-12-31 ENCOUNTER — Ambulatory Visit (INDEPENDENT_AMBULATORY_CARE_PROVIDER_SITE_OTHER): Payer: Managed Care, Other (non HMO)

## 2017-12-31 VITALS — BP 106/71 | HR 74 | Wt 266.0 lb

## 2017-12-31 DIAGNOSIS — Z3402 Encounter for supervision of normal first pregnancy, second trimester: Secondary | ICD-10-CM

## 2017-12-31 DIAGNOSIS — Z363 Encounter for antenatal screening for malformations: Secondary | ICD-10-CM | POA: Diagnosis not present

## 2017-12-31 DIAGNOSIS — Z3A18 18 weeks gestation of pregnancy: Secondary | ICD-10-CM

## 2017-12-31 DIAGNOSIS — Z1389 Encounter for screening for other disorder: Secondary | ICD-10-CM

## 2017-12-31 DIAGNOSIS — Z331 Pregnant state, incidental: Secondary | ICD-10-CM

## 2017-12-31 LAB — POCT URINALYSIS DIPSTICK OB
Glucose, UA: NEGATIVE
KETONES UA: NEGATIVE
Leukocytes, UA: NEGATIVE
NITRITE UA: NEGATIVE
PROTEIN: NEGATIVE

## 2017-12-31 MED ORDER — OMEPRAZOLE 20 MG PO CPDR: 20.0000 mg | DELAYED_RELEASE_CAPSULE | Freq: Every day | ORAL | 3 refills | Status: DC

## 2017-12-31 NOTE — Patient Instructions (Signed)
Margaret Pierce, I greatly value your feedback.  If you receive a survey following your visit with [place] today, we appreciate you taking the time to fill it out.  Thanks, Joellyn Haff, CNM, WHNP-BC   Second Trimester of Pregnancy The second trimester is from week 14 through week 27 (months 4 through 6). The second trimester is often a time when you feel your best. Your body has adjusted to being pregnant, and you begin to feel better physically. Usually, morning sickness has lessened or quit completely, you may have more energy, and you may have an increase in appetite. The second trimester is also a time when the fetus is growing rapidly. At the end of the sixth month, the fetus is about 9 inches long and weighs about 1 pounds. You will likely begin to feel the baby move (quickening) between 16 and 20 weeks of pregnancy. Body changes during your second trimester Your body continues to go through many changes during your second trimester. The changes vary from woman to woman.  Your weight will continue to increase. You will notice your lower abdomen bulging out.  You may begin to get stretch marks on your hips, abdomen, and breasts.  You may develop headaches that can be relieved by medicines. The medicines should be approved by your health care provider.  You may urinate more often because the fetus is pressing on your bladder.  You may develop or continue to have heartburn as a result of your pregnancy.  You may develop constipation because certain hormones are causing the muscles that push waste through your intestines to slow down.  You may develop hemorrhoids or swollen, bulging veins (varicose veins).  You may have back pain. This is caused by: ? Weight gain. ? Pregnancy hormones that are relaxing the joints in your pelvis. ? A shift in weight and the muscles that support your balance.  Your breasts will continue to grow and they will continue to become tender.  Your gums may bleed and  may be sensitive to brushing and flossing.  Dark spots or blotches (chloasma, mask of pregnancy) may develop on your face. This will likely fade after the baby is born.  A dark line from your belly button to the pubic area (linea nigra) may appear. This will likely fade after the baby is born.  You may have changes in your hair. These can include thickening of your hair, rapid growth, and changes in texture. Some women also have hair loss during or after pregnancy, or hair that feels dry or thin. Your hair will most likely return to normal after your baby is born.  What to expect at prenatal visits During a routine prenatal visit:  You will be weighed to make sure you and the fetus are growing normally.  Your blood pressure will be taken.  Your abdomen will be measured to track your baby's growth.  The fetal heartbeat will be listened to.  Any test results from the previous visit will be discussed.  Your health care provider may ask you:  How you are feeling.  If you are feeling the baby move.  If you have had any abnormal symptoms, such as leaking fluid, bleeding, severe headaches, or abdominal cramping.  If you are using any tobacco products, including cigarettes, chewing tobacco, and electronic cigarettes.  If you have any questions.  Other tests that may be performed during your second trimester include:  Blood tests that check for: ? Low iron levels (anemia). ? High  blood sugar that affects pregnant women (gestational diabetes) between 3 and 28 weeks. ? Rh antibodies. This is to check for a protein on red blood cells (Rh factor).  Urine tests to check for infections, diabetes, or protein in the urine.  An ultrasound to confirm the proper growth and development of the baby.  An amniocentesis to check for possible genetic problems.  Fetal screens for spina bifida and Down syndrome.  HIV (human immunodeficiency virus) testing. Routine prenatal testing includes  screening for HIV, unless you choose not to have this test.  Follow these instructions at home: Medicines  Follow your health care provider's instructions regarding medicine use. Specific medicines may be either safe or unsafe to take during pregnancy.  Take a prenatal vitamin that contains at least 600 micrograms (mcg) of folic acid.  If you develop constipation, try taking a stool softener if your health care provider approves. Eating and drinking  Eat a balanced diet that includes fresh fruits and vegetables, whole grains, good sources of protein such as meat, eggs, or tofu, and low-fat dairy. Your health care provider will help you determine the amount of weight gain that is right for you.  Avoid raw meat and uncooked cheese. These carry germs that can cause birth defects in the baby.  If you have low calcium intake from food, talk to your health care provider about whether you should take a daily calcium supplement.  Limit foods that are high in fat and processed sugars, such as fried and sweet foods.  To prevent constipation: ? Drink enough fluid to keep your urine clear or pale yellow. ? Eat foods that are high in fiber, such as fresh fruits and vegetables, whole grains, and beans. Activity  Exercise only as directed by your health care provider. Most women can continue their usual exercise routine during pregnancy. Try to exercise for 30 minutes at least 5 days a week. Stop exercising if you experience uterine contractions.  Avoid heavy lifting, wear low heel shoes, and practice good posture.  A sexual relationship may be continued unless your health care provider directs you otherwise. Relieving pain and discomfort  Wear a good support bra to prevent discomfort from breast tenderness.  Take warm sitz baths to soothe any pain or discomfort caused by hemorrhoids. Use hemorrhoid cream if your health care provider approves.  Rest with your legs elevated if you have leg cramps  or low back pain.  If you develop varicose veins, wear support hose. Elevate your feet for 15 minutes, 3-4 times a day. Limit salt in your diet. Prenatal Care  Write down your questions. Take them to your prenatal visits.  Keep all your prenatal visits as told by your health care provider. This is important. Safety  Wear your seat belt at all times when driving.  Make a list of emergency phone numbers, including numbers for family, friends, the hospital, and police and fire departments. General instructions  Ask your health care provider for a referral to a local prenatal education class. Begin classes no later than the beginning of month 6 of your pregnancy.  Ask for help if you have counseling or nutritional needs during pregnancy. Your health care provider can offer advice or refer you to specialists for help with various needs.  Do not use hot tubs, steam rooms, or saunas.  Do not douche or use tampons or scented sanitary pads.  Do not cross your legs for long periods of time.  Avoid cat litter boxes and soil  used by cats. These carry germs that can cause birth defects in the baby and possibly loss of the fetus by miscarriage or stillbirth.  Avoid all smoking, herbs, alcohol, and unprescribed drugs. Chemicals in these products can affect the formation and growth of the baby.  Do not use any products that contain nicotine or tobacco, such as cigarettes and e-cigarettes. If you need help quitting, ask your health care provider.  Visit your dentist if you have not gone yet during your pregnancy. Use a soft toothbrush to brush your teeth and be gentle when you floss. Contact a health care provider if:  You have dizziness.  You have mild pelvic cramps, pelvic pressure, or nagging pain in the abdominal area.  You have persistent nausea, vomiting, or diarrhea.  You have a bad smelling vaginal discharge.  You have pain when you urinate. Get help right away if:  You have a  fever.  You are leaking fluid from your vagina.  You have spotting or bleeding from your vagina.  You have severe abdominal cramping or pain.  You have rapid weight gain or weight loss.  You have shortness of breath with chest pain.  You notice sudden or extreme swelling of your face, hands, ankles, feet, or legs.  You have not felt your baby move in over an hour.  You have severe headaches that do not go away when you take medicine.  You have vision changes. Summary  The second trimester is from week 14 through week 27 (months 4 through 6). It is also a time when the fetus is growing rapidly.  Your body goes through many changes during pregnancy. The changes vary from woman to woman.  Avoid all smoking, herbs, alcohol, and unprescribed drugs. These chemicals affect the formation and growth your baby.  Do not use any tobacco products, such as cigarettes, chewing tobacco, and e-cigarettes. If you need help quitting, ask your health care provider.  Contact your health care provider if you have any questions. Keep all prenatal visits as told by your health care provider. This is important. This information is not intended to replace advice given to you by your health care provider. Make sure you discuss any questions you have with your health care provider. Document Released: 03/13/2001 Document Revised: 08/25/2015 Document Reviewed: 05/20/2012 Elsevier Interactive Patient Education  2017 [employer].

## 2017-12-31 NOTE — Progress Notes (Signed)
[place] 18+3 wks,breech,posterior placenta gr 0,normal ovaries bilat,svp of fluid 6 cm,fhr 150 bpm,cx 5.3 cm,LVEICF 2 mm,EFW 257 g 65%,anatomy complete

## 2017-12-31 NOTE — Progress Notes (Signed)
LOW-RISK PREGNANCY VISIT Patient name: Margaret Pierce MRN [id number]  Date of birth: Mar 19, 1991 Chief Complaint:   Routine Prenatal Visit ([place] today; both hips hurt)  History of Present Illness:   Margaret Pierce is a 27 y.o. G1P0 female at [redacted]w[redacted]d with an Estimated Date of Delivery: 05/31/18 being seen today for ongoing management of a low-risk pregnancy.  Today she reports hips hurt, requests rx for prilosec (taking otc, too expensive).  . Vag. Bleeding: None.  Movement: Present. denies leaking of fluid. Review of Systems:   Pertinent items are noted in HPI Denies abnormal vaginal discharge w/ itching/odor/irritation, headaches, visual changes, shortness of breath, chest pain, abdominal pain, severe nausea/vomiting, or problems with urination or bowel movements unless otherwise stated above. Pertinent History Reviewed:  Reviewed past medical,surgical, social, obstetrical and family history.  Reviewed problem list, medications and allergies. Physical Assessment:   Vitals:   12/31/17 1206  BP: 106/71  Pulse: 74  Weight: 266 lb (120.7 kg)  Body mass index is 45.66 kg/m.        Physical Examination:   General appearance: Well appearing, and in no distress  Mental status: Alert, oriented to person, place, and time  Skin: Warm & dry  Cardiovascular: Normal heart rate noted  Respiratory: Normal respiratory effort, no distress  Abdomen: Soft, gravid, nontender  Pelvic: Cervical exam deferred         Extremities: Edema: Trace  Fetal Status: Fetal Heart Rate (bpm): 150 u/s   Movement: Present    [place] 18+3 wks,breech,posterior placenta gr 0,normal ovaries bilat,svp of fluid 6 cm,fhr 150 bpm,cx 5.3 cm,LVEICF 2 mm,EFW 257 g 65%,anatomy complete  Results for orders placed or performed in visit on 12/31/17 (from the past 24 hour(s))  POC Urinalysis Dipstick OB   Collection Time: 12/31/17 12:07 PM  Result Value Ref Range   Color, UA     Clarity, UA     Glucose, UA Negative Negative   Bilirubin, UA     Ketones, UA neg    Spec Grav, UA     Blood, UA trace    pH, UA     POC Protein UA Negative Negative, Trace   Urobilinogen, UA     Nitrite, UA neg    Leukocytes, UA Negative Negative   Appearance     Odor      Assessment & Plan:  1) Low-risk pregnancy G1P0 at [redacted]w[redacted]d with an Estimated Date of Delivery: 05/31/18   2) Hips hurt, discussed relief measures  3) GERD> rx prilosec  4) Fetal isolated EICF, discussed soft marker for T21, offered genetic screening, declines, will repeat u/s @ 28wks. Gave printed info   Meds:  Meds ordered this encounter  Medications  . omeprazole (PRILOSEC) 20 MG capsule    Sig: Take 1 capsule (20 mg total) by mouth daily.    Dispense:  90 capsule    Refill:  3    Order Specific Question:   Supervising Provider    Answer:   Lazaro Arms [[id number]]   Labs/procedures today: anatomy u/s, getting flu shot at work  Plan:  Continue routine obstetrical care   Reviewed: Preterm labor symptoms and general obstetric precautions including but not limited to vaginal bleeding, contractions, leaking of fluid and fetal movement were reviewed in detail with the patient.  All questions were answered  Follow-up: Return in about 4 weeks (around 01/28/2018) for LROB.  Orders Placed This Encounter  Procedures  . POC Urinalysis Dipstick OB   Merlene Laughter  Cedric Fishman, [facility] 12/31/2017 12:40 PM

## 2018-01-30 ENCOUNTER — Other Ambulatory Visit: Payer: Self-pay

## 2018-01-30 ENCOUNTER — Encounter: Payer: Self-pay | Admitting: Advanced Practice Midwife

## 2018-01-30 ENCOUNTER — Ambulatory Visit (INDEPENDENT_AMBULATORY_CARE_PROVIDER_SITE_OTHER): Payer: Managed Care, Other (non HMO) | Admitting: Advanced Practice Midwife

## 2018-01-30 VITALS — BP 116/74 | HR 79 | Wt 273.0 lb

## 2018-01-30 DIAGNOSIS — Z1389 Encounter for screening for other disorder: Secondary | ICD-10-CM

## 2018-01-30 DIAGNOSIS — Z362 Encounter for other antenatal screening follow-up: Secondary | ICD-10-CM

## 2018-01-30 DIAGNOSIS — Z3A22 22 weeks gestation of pregnancy: Secondary | ICD-10-CM

## 2018-01-30 DIAGNOSIS — Z331 Pregnant state, incidental: Secondary | ICD-10-CM

## 2018-01-30 DIAGNOSIS — Z3402 Encounter for supervision of normal first pregnancy, second trimester: Secondary | ICD-10-CM

## 2018-01-30 LAB — POCT URINALYSIS DIPSTICK OB
Blood, UA: NEGATIVE
Glucose, UA: NEGATIVE
KETONES UA: NEGATIVE
LEUKOCYTES UA: NEGATIVE
NITRITE UA: NEGATIVE
PROTEIN: NEGATIVE

## 2018-01-30 MED ORDER — PRENATAL ADULT GUMMY/DHA/FA 0.4-25 MG PO CHEW: 1.0000 | CHEWABLE_TABLET | Freq: Every day | ORAL | 3 refills | Status: DC

## 2018-01-30 NOTE — Patient Instructions (Signed)

## 2018-01-30 NOTE — Progress Notes (Signed)
  G1P0 [redacted]w[redacted]d Estimated Date of Delivery: 05/31/18  Blood pressure 116/74, pulse 79, weight 273 lb (123.8 kg), last menstrual period 08/12/2017.   BP weight and urine results all reviewed and noted.  Please refer to the obstetrical flow sheet for the fundal height and fetal heart rate documentation:  Patient reports good fetal movement, denies any bleeding and no rupture of membranes symptoms or regular contractionsa Patient has what sounds like some abdominal wall pain from time to time, feels better today. Also having coughing fits.  All questions were answered.   Physical Assessment:   Vitals:   01/30/18 1111  BP: 116/74  Pulse: 79  Weight: 273 lb (123.8 kg)  Body mass index is 46.86 kg/m.        Physical Examination:   General appearance: Well appearing, and in no distress  Mental status: Alert, oriented to person, place, and time  Skin: Warm & dry  Cardiovascular: Normal heart rate noted  Respiratory: Normal respiratory effort, no distress  Abdomen: Soft, gravid, nontender  Pelvic: Cervical exam deferred         Extremities: Edema: Trace  Fetal Status: Fetal Heart Rate (bpm): 147 Fundal Height: 23 cm Movement: Present    Results for orders placed or performed in visit on 01/30/18 (from the past 24 hour(s))  POC Urinalysis Dipstick OB   Collection Time: 01/30/18 11:11 AM  Result Value Ref Range   Color, UA     Clarity, UA     Glucose, UA Negative Negative   Bilirubin, UA     Ketones, UA neg    Spec Grav, UA     Blood, UA neg    pH, UA     POC Protein UA Negative Negative, Trace   Urobilinogen, UA     Nitrite, UA neg    Leukocytes, UA Negative Negative   Appearance     Odor       Orders Placed This Encounter  Procedures  . US OB Follow Up  . POC Urinalysis Dipstick OB    Plan:  Continued routine obstetrical care,   Return in about 4 weeks (around 02/27/2018) for PN2/LROB, US:OB F/U:.

## 2018-02-17 ENCOUNTER — Ambulatory Visit (INDEPENDENT_AMBULATORY_CARE_PROVIDER_SITE_OTHER): Payer: Managed Care, Other (non HMO) | Admitting: Family Medicine

## 2018-02-17 ENCOUNTER — Encounter: Payer: Self-pay | Admitting: Family Medicine

## 2018-02-17 VITALS — BP 122/82 | Temp 98.2°F | Ht 64.0 in | Wt 281.4 lb

## 2018-02-17 DIAGNOSIS — J019 Acute sinusitis, unspecified: Secondary | ICD-10-CM

## 2018-02-17 DIAGNOSIS — B9689 Other specified bacterial agents as the cause of diseases classified elsewhere: Secondary | ICD-10-CM

## 2018-02-17 MED ORDER — AMOXICILLIN 500 MG PO TABS: 500.0000 mg | ORAL_TABLET | Freq: Three times a day (TID) | ORAL | 0 refills | Status: DC

## 2018-02-17 NOTE — Progress Notes (Signed)
   Subjective:    Patient ID: Margaret Pierce, female    DOB: 07/29/1990, 27 y.o.   MRN: [id number]  Cough  This is a new problem. The current episode started in the past 7 days. Associated symptoms include ear pain, a fever, headaches, nasal congestion, rhinorrhea and a sore throat. Pertinent negatives include no chest pain, shortness of breath or wheezing. Treatments tried: tylenol, Robitussin.   Patient to be [redacted] weeks pregnant Doing well with this When she was a young child she had allergic reaction to [facility] [employer] She denies any current allergies She states she has been able to take amoxicillin in the past without trouble. [redacted] weeks Pregnant  Review of Systems  Constitutional: Positive for fever. Negative for activity change.  HENT: Positive for congestion, ear pain, rhinorrhea and sore throat.   Eyes: Negative for discharge.  Respiratory: Positive for cough. Negative for shortness of breath and wheezing.   Cardiovascular: Negative for chest pain.  Neurological: Positive for headaches.       Objective:   Physical Exam  Constitutional: She appears well-developed.  HENT:  Head: Normocephalic.  Nose: Nose normal.  Mouth/Throat: Oropharynx is clear and moist. No oropharyngeal exudate.  Neck: Neck supple.  Cardiovascular: Normal rate and normal heart sounds.  No murmur heard. Pulmonary/Chest: Effort normal and breath sounds normal. She has no wheezes.  Lymphadenopathy:    She has no cervical adenopathy.  Skin: Skin is warm and dry.  Nursing note and vitals reviewed.         Assessment & Plan:  Acute rhinosinusitis Started office virus secondary sinus infection now Recommend amoxicillin 3 times daily 10 days Warning signs regarding allergy discussed with patient She has taken this before without trouble according to her  Pregnancy going fine to follow through with gynecology

## 2018-03-03 ENCOUNTER — Other Ambulatory Visit: Payer: Managed Care, Other (non HMO)

## 2018-03-07 ENCOUNTER — Other Ambulatory Visit: Payer: Self-pay

## 2018-03-07 ENCOUNTER — Ambulatory Visit (INDEPENDENT_AMBULATORY_CARE_PROVIDER_SITE_OTHER): Payer: Managed Care, Other (non HMO) | Admitting: Obstetrics & Gynecology

## 2018-03-07 ENCOUNTER — Ambulatory Visit (INDEPENDENT_AMBULATORY_CARE_PROVIDER_SITE_OTHER): Payer: Managed Care, Other (non HMO)

## 2018-03-07 ENCOUNTER — Encounter: Payer: Self-pay | Admitting: Obstetrics & Gynecology

## 2018-03-07 ENCOUNTER — Other Ambulatory Visit: Payer: Managed Care, Other (non HMO)

## 2018-03-07 VITALS — BP 103/61 | HR 78 | Wt 285.0 lb

## 2018-03-07 DIAGNOSIS — Z3A27 27 weeks gestation of pregnancy: Secondary | ICD-10-CM

## 2018-03-07 DIAGNOSIS — Z3402 Encounter for supervision of normal first pregnancy, second trimester: Secondary | ICD-10-CM

## 2018-03-07 DIAGNOSIS — Z331 Pregnant state, incidental: Secondary | ICD-10-CM

## 2018-03-07 DIAGNOSIS — Z362 Encounter for other antenatal screening follow-up: Secondary | ICD-10-CM | POA: Diagnosis not present

## 2018-03-07 DIAGNOSIS — Z23 Encounter for immunization: Secondary | ICD-10-CM

## 2018-03-07 DIAGNOSIS — Z1389 Encounter for screening for other disorder: Secondary | ICD-10-CM

## 2018-03-07 LAB — POCT URINALYSIS DIPSTICK OB
Blood, UA: NEGATIVE
KETONES UA: NEGATIVE
Leukocytes, UA: NEGATIVE
Nitrite, UA: NEGATIVE
PROTEIN: NEGATIVE

## 2018-03-07 NOTE — Progress Notes (Signed)
   LOW-RISK PREGNANCY VISIT Patient name: Margaret Pierce MRN [id number]  Date of birth: 10-Oct-1990 Chief Complaint:   Routine Prenatal Visit (PN2 today)  History of Present Illness:   Margaret Pierce is a 27 y.o. G1P0 female at 1939w6d with an Estimated Date of Delivery: 05/31/18 being seen today for ongoing management of a low-risk pregnancy.  Today she reports no complaints.  . Vag. Bleeding: None.  Movement: Present. denies leaking of fluid. Review of Systems:   Pertinent items are noted in HPI Denies abnormal vaginal discharge w/ itching/odor/irritation, headaches, visual changes, shortness of breath, chest pain, abdominal pain, severe nausea/vomiting, or problems with urination or bowel movements unless otherwise stated above. Pertinent History Reviewed:  Reviewed past medical,surgical, social, obstetrical and family history.  Reviewed problem list, medications and allergies. Physical Assessment:   Vitals:   03/07/18 1019  BP: 103/61  Pulse: 78  Weight: 285 lb (129.3 kg)  Body mass index is 48.92 kg/m.        Physical Examination:   General appearance: Well appearing, and in no distress  Mental status: Alert, oriented to person, place, and time  Skin: Warm & dry  Cardiovascular: Normal heart rate noted  Respiratory: Normal respiratory effort, no distress  Abdomen: Soft, gravid, nontender  Pelvic: Cervical exam deferred         Extremities: Edema: Trace  Fetal Status:     Movement: Present    Results for orders placed or performed in visit on 03/07/18 (from the past 24 hour(s))  POC Urinalysis Dipstick OB   Collection Time: 03/07/18 10:19 AM  Result Value Ref Range   Color, UA     Clarity, UA     Glucose, UA Trace (A) Negative   Bilirubin, UA     Ketones, UA neg    Spec Grav, UA     Blood, UA neg    pH, UA     POC,PROTEIN,UA Negative Negative, Trace, Small (1+), Moderate (2+), Large (3+), 4+   Urobilinogen, UA     Nitrite, UA neg    Leukocytes, UA Negative Negative     Appearance     Odor      Assessment & Plan:  1) Low-risk pregnancy G1P0 at 3339w6d with an Estimated Date of Delivery: 05/31/18   2) Isolated LV EICF   Meds: No orders of the defined types were placed in this encounter.  Labs/procedures today:   Plan:  Continue routine obstetrical care   Reviewed: Preterm labor symptoms and general obstetric precautions including but not limited to vaginal bleeding, contractions, leaking of fluid and fetal movement were reviewed in detail with the patient.  All questions were answered  Follow-up: No follow-ups on file.  Orders Placed This Encounter  Procedures  . Tdap vaccine greater than or equal to 7yo IM  . POC Urinalysis Dipstick OB   Lazaro ArmsLuther H Kazuto Sevey  03/07/2018 10:43 AM

## 2018-03-07 NOTE — Progress Notes (Signed)
US 27+6 wks,breech,cx 5.1 cm,posterior placenta gr 0,AFI 15 cm,fhr 152 bpm,LVEICF n/c,EFW 1336 g 83%

## 2018-03-08 LAB — CBC
HEMATOCRIT: 35.1 % (ref 34.0–46.6)
HEMOGLOBIN: 12.2 g/dL (ref 11.1–15.9)
MCH: 31 pg (ref 26.6–33.0)
MCHC: 34.8 g/dL (ref 31.5–35.7)
MCV: 89 fL (ref 79–97)
Platelets: 220 10*3/uL (ref 150–450)
RBC: 3.93 x10E6/uL (ref 3.77–5.28)
RDW: 11.9 % — AB (ref 12.3–15.4)
WBC: 15.7 10*3/uL — ABNORMAL HIGH (ref 3.4–10.8)

## 2018-03-08 LAB — ANTIBODY SCREEN: Antibody Screen: NEGATIVE

## 2018-03-08 LAB — GLUCOSE TOLERANCE, 2 HOURS W/ 1HR
GLUCOSE, FASTING: 78 mg/dL (ref 65–91)
Glucose, 1 hour: 160 mg/dL (ref 65–179)
Glucose, 2 hour: 115 mg/dL (ref 65–152)

## 2018-03-08 LAB — HIV ANTIBODY (ROUTINE TESTING W REFLEX): HIV Screen 4th Generation wRfx: NONREACTIVE

## 2018-03-08 LAB — RPR: RPR: NONREACTIVE

## 2018-03-17 ENCOUNTER — Other Ambulatory Visit: Payer: Self-pay

## 2018-03-17 ENCOUNTER — Encounter (HOSPITAL_COMMUNITY): Payer: Self-pay

## 2018-03-17 ENCOUNTER — Inpatient Hospital Stay (HOSPITAL_COMMUNITY)
Admission: AD | Admit: 2018-03-17 | Discharge: 2018-03-17 | Disposition: A | Discharge status: Home / Self Care | Payer: Managed Care, Other (non HMO) | Source: Ambulatory Visit | Attending: Obstetrics & Gynecology | Admitting: Obstetrics & Gynecology

## 2018-03-17 DIAGNOSIS — O26893 Other specified pregnancy related conditions, third trimester: Secondary | ICD-10-CM | POA: Diagnosis not present

## 2018-03-17 DIAGNOSIS — O212 Late vomiting of pregnancy: Secondary | ICD-10-CM | POA: Diagnosis not present

## 2018-03-17 DIAGNOSIS — A059 Bacterial foodborne intoxication, unspecified: Secondary | ICD-10-CM | POA: Diagnosis not present

## 2018-03-17 DIAGNOSIS — Z87891 Personal history of nicotine dependence: Secondary | ICD-10-CM | POA: Diagnosis not present

## 2018-03-17 DIAGNOSIS — Z880 Allergy status to penicillin: Secondary | ICD-10-CM | POA: Insufficient documentation

## 2018-03-17 DIAGNOSIS — Z3A29 29 weeks gestation of pregnancy: Secondary | ICD-10-CM | POA: Diagnosis not present

## 2018-03-17 LAB — COMPREHENSIVE METABOLIC PANEL
ALT: 23 U/L (ref 0–44)
AST: 23 U/L (ref 15–41)
Albumin: 3.1 g/dL — ABNORMAL LOW (ref 3.5–5.0)
Alkaline Phosphatase: 106 U/L (ref 38–126)
Anion gap: 10 (ref 5–15)
BUN: 12 mg/dL (ref 6–20)
CALCIUM: 8.4 mg/dL — AB (ref 8.9–10.3)
CO2: 18 mmol/L — ABNORMAL LOW (ref 22–32)
CREATININE: 0.45 mg/dL (ref 0.44–1.00)
Chloride: 107 mmol/L (ref 98–111)
GFR calc non Af Amer: >60 mL/min (ref >60)
Glucose, Bld: 163 mg/dL — ABNORMAL HIGH (ref 70–99)
Potassium: 3.7 mmol/L (ref 3.5–5.1)
Sodium: 135 mmol/L (ref 135–145)
Total Bilirubin: 0.8 mg/dL (ref 0.3–1.2)
Total Protein: 6.6 g/dL (ref 6.5–8.1)

## 2018-03-17 LAB — CBC
HCT: 39.9 % (ref 36.0–46.0)
HEMOGLOBIN: 13.2 g/dL (ref 12.0–15.0)
MCH: 30.9 pg (ref 26.0–34.0)
MCHC: 33.1 g/dL (ref 30.0–36.0)
MCV: 93.4 fL (ref 80.0–100.0)
Platelets: 188 10*3/uL (ref 150–400)
RBC: 4.27 MIL/uL (ref 3.87–5.11)
RDW: 13.3 % (ref 11.5–15.5)
WBC: 16 10*3/uL — ABNORMAL HIGH (ref 4.0–10.5)

## 2018-03-17 LAB — URINALYSIS, ROUTINE W REFLEX MICROSCOPIC
Bilirubin Urine: NEGATIVE
Glucose, UA: NEGATIVE mg/dL
Ketones, ur: >80 mg/dL — AB
Leukocytes, UA: NEGATIVE
Nitrite: NEGATIVE
Protein, ur: NEGATIVE mg/dL
Specific Gravity, Urine: >1.03 — ABNORMAL HIGH (ref 1.005–1.030)
pH: 5.5 (ref 5.0–8.0)

## 2018-03-17 LAB — URINALYSIS, MICROSCOPIC (REFLEX)

## 2018-03-17 MED ORDER — LOPERAMIDE HCL 2 MG PO CAPS
2.0000 mg | ORAL_CAPSULE | ORAL | Status: DC | PRN
  Administered 2018-03-17: 2 mg via ORAL
  Filled 2018-03-17: qty 1

## 2018-03-17 MED ORDER — ONDANSETRON 8 MG PO TBDP: 8.0000 mg | ORAL_TABLET | Freq: Three times a day (TID) | ORAL | 0 refills | Status: DC | PRN

## 2018-03-17 MED ORDER — SODIUM CHLORIDE 0.9 % IV SOLN
8.0000 mg | Freq: Once | INTRAVENOUS | Status: AC
  Administered 2018-03-17: 8 mg via INTRAVENOUS
  Filled 2018-03-17: qty 4

## 2018-03-17 MED ORDER — DICYCLOMINE HCL 20 MG PO TABS: 20.0000 mg | ORAL_TABLET | Freq: Two times a day (BID) | ORAL | 0 refills | Status: DC

## 2018-03-17 MED ORDER — PROMETHAZINE HCL 25 MG/ML IJ SOLN
25.0000 mg | Freq: Once | INTRAMUSCULAR | Status: AC
  Administered 2018-03-17: 25 mg via INTRAVENOUS
  Filled 2018-03-17: qty 1

## 2018-03-17 MED ORDER — LACTATED RINGERS IV BOLUS
1000.0000 mL | Freq: Once | INTRAVENOUS | Status: AC
  Administered 2018-03-17 (×2): 1000 mL via INTRAVENOUS

## 2018-03-17 NOTE — MAU Provider Note (Signed)
Patient Margaret NobleKalynn L Yager is a 27 y.o. G1P0 At 1832w2d here with complaints of nausea and vomiting that started at  5 pm. She denies LOF, decreased fetal movements, vaginal bleeding, contractions or other ob-gyn complaints.  [facility], then starting having intense NV and diarrhea at 5 pm.  History     CSN: [id number]  Arrival date and time: 03/17/18 0041   None     Chief Complaint  Patient presents with  . Emesis  . Nausea  . Diarrhea   Emesis   This is a new problem. The current episode started yesterday. The problem occurs more than 10 times per day. The problem has been unchanged. The emesis has an appearance of stomach contents and bile. There has been no fever. Associated symptoms include abdominal pain. Risk factors include suspect food intake. She has tried bed rest, diet change and sleep for the symptoms.    OB History    Gravida  1   Para      Term      Preterm      AB      Living        SAB      TAB      Ectopic      Multiple      Live Births              Past Medical History:  Diagnosis Date  . ADD (attention deficit disorder)   . Contraceptive management 08/19/2013  . Dysmenorrhea 06/26/2012  . Hematuria 03/22/2014  . IBS (irritable bowel syndrome) 11/25/2014  . Menorrhagia 06/26/2012  . RLQ abdominal pain 03/22/2014  . Urinary frequency 03/22/2014  . Weight gain 08/19/2013    Past Surgical History:  Procedure Laterality Date  . KNEE ARTHROSCOPY Bilateral 2007, 2009  . WISDOM TOOTH EXTRACTION      Family History  Problem Relation Age of Onset  . Hypertension Mother   . Thyroid disease Maternal Grandmother   . Alcohol abuse Father   . Diabetes Father   . Cirrhosis Father   . Other Paternal Grandfather        cirrhosis  . Cancer Maternal Grandfather        colon    Social History   Tobacco Use  . Smoking status: Former Smoker    Packs/day: 0.50    Years: 0.50    Pack years: 0.25    Types: Cigarettes  .  Smokeless tobacco: Never Used  Substance Use Topics  . Alcohol use: Not Currently    Comment: occ  . Drug use: No    Allergies:  Allergies  Allergen Reactions  . Cefpodoxime Hives and Rash    Vantin as a child Has taken and tolerated Amoxil since then  . Codeine Nausea And Vomiting    Medications Prior to Admission  Medication Sig Dispense Refill Last Dose  . cetirizine (ZYRTEC) 10 MG tablet Take 10 mg by mouth daily.   03/16/2018 at Unknown time  . Doxylamine-Pyridoxine ER (BONJESTA) 20-20 MG TBCR Take 1 tablet by mouth 2 (two) times daily at 8 am and 10 pm. 36 tablet 0 03/16/2018 at Unknown time  . omeprazole (PRILOSEC) 20 MG capsule Take 1 capsule (20 mg total) by mouth daily. 90 capsule 3 03/16/2018 at Unknown time  . prenatal vitamin w/FE, FA (PRENATAL 1 + 1) 27-1 MG TABS tablet Take 1 tablet by mouth daily at 12 noon. 30 each 12 03/16/2018 at Unknown time  .  acetaminophen (TYLENOL) 500 MG tablet Take 1,000 mg by mouth as needed.   Unknown at Unknown time  . dicyclomine (BENTYL) 20 MG tablet Take 1 tablet (20 mg total) by mouth 4 (four) times daily -  before meals and at bedtime. (Patient taking differently: Take 20 mg by mouth as needed. ) 80 tablet 3 Unknown at Unknown time    Review of Systems  Constitutional: Negative.   HENT: Negative.   Respiratory: Negative.   Cardiovascular: Negative.   Gastrointestinal: Positive for abdominal pain, nausea and vomiting.  Genitourinary: Negative.   Neurological: Negative.   Hematological: Negative.   Psychiatric/Behavioral: Negative.    Physical Exam   Blood pressure 123/70, pulse (!) 132, temperature 98.6 F (37 C), temperature source Oral, resp. rate 20, last menstrual period 08/12/2017, SpO2 97 %.  Physical Exam  Constitutional: She is oriented to person, place, and time. She appears well-developed.  Neck: Normal range of motion.  Respiratory: Effort normal.  GI: Soft.  Musculoskeletal: Normal range of motion.   Neurological: She is alert and oriented to person, place, and time.  Skin: Skin is warm and dry.  Psychiatric: She has a normal mood and affect.    MAU Course  Procedures   MDM -CBC and CMP normal, a febrile.  -Phenergan, LR, Zofran, immodium-patient now sleeping and has not had any vomiting in over an hour -NST: 150 bpm, mod var, present acel, neg decels, no contractions.   Patient Vitals for the past 24 hrs:  BP Temp Temp src Pulse Resp SpO2  03/17/18 0506 (!) 106/54 99.3 F (37.4 C) Oral (!) 114 18 97 %  03/17/18 0106 123/70 98.6 F (37 C) Oral (!) 132 20 97 %     Assessment and Plan   1. Food poisoning    2. Discharge home with BRAT diet plan, Zofran.  3. Call FT if patient does not feel better by Wednesday, may need antibiotic.  4. Return to MAU if vomiting blood or bloody diarrhea, abdominal pain increases or spreads, fever, SOB or other concerning symptom.   Charlesetta Garibaldi Kooistra 03/17/2018, 2:49 AM

## 2018-03-17 NOTE — Discharge Instructions (Signed)
-Take dissolving zofran every 8 hours, after you feel less nauseated then take Bentyl for your stomach cramps.  -call FT and check in with them if you do not feel better by Wednesday, if you develop any of the following come back to MAU: -return to MAU if you develop fever, bloody diarrhea or bloody vomit (small flecks of blood are normal at this point as your stomach lining is irritated but increased blood vomiting means that you need to come back to MAU)  Food Poisoning Food poisoning is an illness that is caused by eating or drinking contaminated foods or drinks. Food poisoning is usually mild and lasts 1-2 days. Foods and drinks can become contaminated because of:  Poor personal hygiene, such as not washing hands well enough or often.  Not storing food properly. For example, not refrigerating raw meat.  Serving, preparing, and storing food on surfaces that are not clean.  Cooking or eating with utensils that are not clean.  The most common causes of this condition are:  Viruses.  Bacteria.  Parasites.  Follow these instructions at home: Eating and drinking   Drink enough fluids to keep your pee (urine) clear or pale yellow. You may need to drink small amounts of clear liquids often.  Avoid: ? Milk. ? Caffeine. ? Alcohol.  Ask your doctor exactly how you should get enough fluid in your body (rehydrate).  Eat small meals often rather than eating large meals. Medicines  Take over-the-counter and prescription medicines only as told by your doctor. Ask your doctor if you should continue to take any of your regular prescribed and over-the-counter medicines.  If you were prescribed an antibiotic medicine, take it as told by your doctor. Do not stop taking the antibiotic even if you start to feel better. General instructions   Wash your hands fully before you prepare food and after you go to the bathroom (use the toilet). Make sure people who live with you also wash their  hands often.  Clean surfaces that you touch with a product that contains chlorine bleach.  Keep all follow-up visits as told by your doctor. This is important. How is this prevented?  Wash your hands, food preparation surfaces, and utensils before and after you handle raw foods.  Use separate food preparation surfaces and storage spaces for raw meat and for fruits and vegetables.  Keep refrigerated foods colder than 39F (5C).  Serve hot foods right away or keep them heated above 139F (60C).  Store dry foods in cool, dry spaces. Keep them away from too much heat or moisture.  Throw out any foods that: ? Do not smell right. ? Are in cans that are bulging.  Follow approved canning procedures.  Heat canned foods fully before you taste them.  Drink bottled or sterile water when you travel. Get help right away if: Call 911 or go to the emergency room if:  You have trouble: ? Breathing. ? Swallowing. ? Talking. ? Moving.  You have blurry vision.  You cannot eat or drink without throwing up (vomiting).  You pass out (faint).  Your eyes turn yellow.  You continue to throw up or have watery poop (diarrhea).  You start to have pain in your belly (abdomen), your belly pain gets worse, or your belly pain stays in one small area.  You have a fever.  You have blood or mucus in your poop (stools), or your poop looks dark black and tarry.  You have signs of dehydration, such  as: ? Dark pee, very little pee, or no pee. ? Cracked lips. ? Not making tears while crying. ? Dry mouth. ? Sunken eyes. ? Sleepiness. ? Weakness. ? Dizziness.  This information is not intended to replace advice given to you by your health care provider. Make sure you discuss any questions you have with your health care provider. Document Released: 09/06/2009 Document Revised: 08/25/2015 Document Reviewed: 09/20/2014 Elsevier Interactive Patient Education  [employer]2018 Elsevier Inc[employer]

## 2018-03-28 ENCOUNTER — Encounter: Payer: Managed Care, Other (non HMO) | Admitting: Women's Health

## 2018-04-01 ENCOUNTER — Encounter: Payer: Self-pay | Admitting: Obstetrics & Gynecology

## 2018-04-01 ENCOUNTER — Ambulatory Visit (INDEPENDENT_AMBULATORY_CARE_PROVIDER_SITE_OTHER): Payer: Managed Care, Other (non HMO) | Admitting: Obstetrics & Gynecology

## 2018-04-01 VITALS — BP 102/64 | HR 95 | Wt 289.0 lb

## 2018-04-01 DIAGNOSIS — Z3A31 31 weeks gestation of pregnancy: Secondary | ICD-10-CM

## 2018-04-01 DIAGNOSIS — Z331 Pregnant state, incidental: Secondary | ICD-10-CM

## 2018-04-01 DIAGNOSIS — Z3403 Encounter for supervision of normal first pregnancy, third trimester: Secondary | ICD-10-CM

## 2018-04-01 DIAGNOSIS — Z1389 Encounter for screening for other disorder: Secondary | ICD-10-CM

## 2018-04-01 LAB — POCT URINALYSIS DIPSTICK OB
Glucose, UA: NEGATIVE
Ketones, UA: NEGATIVE
Leukocytes, UA: NEGATIVE
Nitrite, UA: NEGATIVE
POC,PROTEIN,UA: NEGATIVE

## 2018-04-01 NOTE — Progress Notes (Signed)
   LOW-RISK PREGNANCY VISIT Patient name: Margaret Pierce MRN [id number]  Date of birth: [birth year] Chief Complaint:   Routine Prenatal Visit (+ pressure)  History of Present Illness:   Margaret Pierce is a 27 y.o. G1P0 female at 8834w3d with an Estimated Date of Delivery: 05/31/18 being seen today for ongoing management of a low-risk pregnancy.  Today she reports no complaints.  . Vag. Bleeding: None.  Movement: Present. denies leaking of fluid. Review of Systems:   Pertinent items are noted in HPI Denies abnormal vaginal discharge w/ itching/odor/irritation, headaches, visual changes, shortness of breath, chest pain, abdominal pain, severe nausea/vomiting, or problems with urination or bowel movements unless otherwise stated above. Pertinent History Reviewed:  Reviewed past medical,surgical, social, obstetrical and family history.  Reviewed problem list, medications and allergies. Physical Assessment:   Vitals:   04/01/18 1613  BP: 102/64  Pulse: 95  Weight: 289 lb (131.1 kg)  Body mass index is 49.61 kg/m.        Physical Examination:   General appearance: Well appearing, and in no distress  Mental status: Alert, oriented to person, place, and time  Skin: Warm & dry  Cardiovascular: Normal heart rate noted  Respiratory: Normal respiratory effort, no distress  Abdomen: Soft, gravid, nontender  Pelvic: Cervical exam deferred         Extremities: Edema: Trace  Fetal Status:   Fundal Height: 31 cm Movement: Present    Results for orders placed or performed in visit on 04/01/18 (from the past 24 hour(s))  POC Urinalysis Dipstick OB   Collection Time: 04/01/18  4:14 PM  Result Value Ref Range   Color, UA     Clarity, UA     Glucose, UA Negative Negative   Bilirubin, UA     Ketones, UA neg    Spec Grav, UA     Blood, UA trace    pH, UA     POC,PROTEIN,UA Negative Negative, Trace, Small (1+), Moderate (2+), Large (3+), 4+   Urobilinogen, UA     Nitrite, UA neg    Leukocytes,  UA Negative Negative   Appearance     Odor      Assessment & Plan:  1) Low-risk pregnancy G1P0 at 1634w3d with an Estimated Date of Delivery: 05/31/18   2)    Meds: No orders of the defined types were placed in this encounter.  Labs/procedures today:   Plan:  Continue routine obstetrical care   Reviewed: Preterm labor symptoms and general obstetric precautions including but not limited to vaginal bleeding, contractions, leaking of fluid and fetal movement were reviewed in detail with the patient.  All questions were answered  Follow-up: Return in about 3 weeks (around 04/22/2018) for LROB.  Orders Placed This Encounter  Procedures  . POC Urinalysis Dipstick OB   Lazaro ArmsLuther H Eure 04/01/2018 4:49 PM

## 2018-04-09 ENCOUNTER — Encounter: Payer: Self-pay | Admitting: Family Medicine

## 2018-04-09 ENCOUNTER — Ambulatory Visit (INDEPENDENT_AMBULATORY_CARE_PROVIDER_SITE_OTHER): Payer: Managed Care, Other (non HMO) | Admitting: Family Medicine

## 2018-04-09 VITALS — Temp 99.1°F | Wt 283.0 lb

## 2018-04-09 DIAGNOSIS — J111 Influenza due to unidentified influenza virus with other respiratory manifestations: Secondary | ICD-10-CM | POA: Diagnosis not present

## 2018-04-09 MED ORDER — OSELTAMIVIR PHOSPHATE 75 MG PO CAPS: 75.0000 mg | ORAL_CAPSULE | Freq: Two times a day (BID) | ORAL | 0 refills | Status: DC

## 2018-04-09 NOTE — Patient Instructions (Signed)

## 2018-04-09 NOTE — Progress Notes (Signed)
   Subjective:    Patient ID: Margaret Pierce, female    DOB: January 12, 1991, 28 y.o.   MRN: [id number]  Cough  This is a new problem. The current episode started in the past 7 days. Associated symptoms include a fever, headaches, myalgias, nasal congestion, rhinorrhea and a sore throat. Pertinent negatives include no chest pain, ear pain, shortness of breath or wheezing. Associated symptoms comments: vomiting. Treatments tried: robitussin and tylenol.  All started 3 AM on Monday morning with fever chills headache body aches not feeling good this progressed over the course the next several days increased coughing congestion body aches not feeling good [redacted] weeks pregnant   Review of Systems  Constitutional: Positive for fever. Negative for activity change.  HENT: Positive for congestion, rhinorrhea and sore throat. Negative for ear pain.   Eyes: Negative for discharge.  Respiratory: Positive for cough. Negative for shortness of breath and wheezing.   Cardiovascular: Negative for chest pain.  Musculoskeletal: Positive for myalgias.  Neurological: Positive for headaches.       Objective:   Physical Exam Vitals signs and nursing note reviewed.  Constitutional:      Appearance: She is well-developed.  HENT:     Head: Normocephalic.     Nose: Nose normal.     Mouth/Throat:     Pharynx: No oropharyngeal exudate.  Neck:     Musculoskeletal: Neck supple.  Cardiovascular:     Rate and Rhythm: Normal rate.     Heart sounds: Normal heart sounds. No murmur.  Pulmonary:     Effort: Pulmonary effort is normal.     Breath sounds: Normal breath sounds. No wheezing.  Lymphadenopathy:     Cervical: No cervical adenopathy.  Skin:    General: Skin is warm and dry.           Assessment & Plan:  Influenza-the patient was diagnosed with influenza. Patient/family educated about the flu and warning signs to watch for. If difficulty breathing,  cyanosis, disorientation, or progressive worsening then  immediately get rechecked at the ER. If progressive symptoms be certain to be rechecked. Supportive measures such as Tylenol/ibuprofen was discussed. No aspirin use in children. Tamiflu was prescribed patient is to follow-up if any complications from the flu warning signs were discussed in detail

## 2018-04-25 ENCOUNTER — Ambulatory Visit (INDEPENDENT_AMBULATORY_CARE_PROVIDER_SITE_OTHER): Payer: Managed Care, Other (non HMO) | Admitting: Women's Health

## 2018-04-25 ENCOUNTER — Encounter: Payer: Self-pay | Admitting: Women's Health

## 2018-04-25 VITALS — BP 110/70 | HR 100 | Wt 291.0 lb

## 2018-04-25 DIAGNOSIS — Z331 Pregnant state, incidental: Secondary | ICD-10-CM

## 2018-04-25 DIAGNOSIS — Z3A34 34 weeks gestation of pregnancy: Secondary | ICD-10-CM

## 2018-04-25 DIAGNOSIS — Z1389 Encounter for screening for other disorder: Secondary | ICD-10-CM

## 2018-04-25 DIAGNOSIS — R319 Hematuria, unspecified: Secondary | ICD-10-CM

## 2018-04-25 DIAGNOSIS — O26843 Uterine size-date discrepancy, third trimester: Secondary | ICD-10-CM

## 2018-04-25 DIAGNOSIS — Z3403 Encounter for supervision of normal first pregnancy, third trimester: Secondary | ICD-10-CM

## 2018-04-25 LAB — POCT URINALYSIS DIPSTICK OB
Blood, UA: 3
GLUCOSE, UA: NEGATIVE
Ketones, UA: NEGATIVE
Leukocytes, UA: NEGATIVE
Nitrite, UA: NEGATIVE
POC,PROTEIN,UA: NEGATIVE

## 2018-04-25 NOTE — Patient Instructions (Signed)
Margaret Pierce, I greatly value your feedback.  If you receive a survey following your visit with us today, we appreciate you taking the time to fill it out.  Thanks, Kim Shavon Ashmore, CNM, WHNP-BC   Call the office ([phone]) or go to [facility] if:  You begin to have strong, frequent contractions  Your water breaks.  Sometimes it is a big gush of fluid, sometimes it is just a trickle that keeps getting your panties wet or running down your legs  You have vaginal bleeding.  It is normal to have a small amount of spotting if your cervix was checked.   You don't feel your baby moving like normal.  If you don't, get you something to eat and drink and lay down and focus on feeling your baby move.  You should feel at least 10 movements in 2 hours.  If you don't, you should call the office or go to [facility].     Preterm Labor and Birth Information  The normal length of a pregnancy is 39-41 weeks. Preterm labor is when labor starts before 37 completed weeks of pregnancy. What are the risk factors for preterm labor? Preterm labor is more likely to occur in women who:  Have certain infections during pregnancy such as a bladder infection, sexually transmitted infection, or infection inside the uterus (chorioamnionitis).  Have a shorter-than-normal cervix.  Have gone into preterm labor before.  Have had surgery on their cervix.  Are younger than age 17 or older than age 35.  Are African American.  Are pregnant with twins or multiple babies (multiple gestation).  Take street drugs or smoke while pregnant.  Do not gain enough weight while pregnant.  Became pregnant shortly after having been pregnant. What are the symptoms of preterm labor? Symptoms of preterm labor include:  Cramps similar to those that can happen during a menstrual period. The cramps may happen with diarrhea.  Pain in the abdomen or lower back.  Regular uterine contractions that may feel like tightening of  the abdomen.  A feeling of increased pressure in the pelvis.  Increased watery or bloody mucus discharge from the vagina.  Water breaking (ruptured amniotic sac). Why is it important to recognize signs of preterm labor? It is important to recognize signs of preterm labor because babies who are born prematurely may not be fully developed. This can put them at an increased risk for:  Long-term (chronic) heart and lung problems.  Difficulty immediately after birth with regulating body systems, including blood sugar, body temperature, heart rate, and breathing rate.  Bleeding in the brain.  Cerebral palsy.  Learning difficulties.  Death. These risks are highest for babies who are born before 34 weeks of pregnancy. How is preterm labor treated? Treatment depends on the length of your pregnancy, your condition, and the health of your baby. It may involve:  Having a stitch (suture) placed in your cervix to prevent your cervix from opening too early (cerclage).  Taking or being given medicines, such as: ? Hormone medicines. These may be given early in pregnancy to help support the pregnancy. ? Medicine to stop contractions. ? Medicines to help mature the baby's lungs. These may be prescribed if the risk of delivery is high. ? Medicines to prevent your baby from developing cerebral palsy. If the labor happens before 34 weeks of pregnancy, you may need to stay in the hospital. What should I do if I think I am in preterm labor? If you think that   you are going into preterm labor, call your health care provider right away. How can I prevent preterm labor in future pregnancies? To increase your chance of having a full-term pregnancy:  Do not use any tobacco products, such as cigarettes, chewing tobacco, and e-cigarettes. If you need help quitting, ask your health care provider.  Do not use street drugs or medicines that have not been prescribed to you during your pregnancy.  Talk with  your health care provider before taking any herbal supplements, even if you have been taking them regularly.  Make sure you gain a healthy amount of weight during your pregnancy.  Watch for infection. If you think that you might have an infection, get it checked right away.  Make sure to tell your health care provider if you have gone into preterm labor before. This information is not intended to replace advice given to you by your health care provider. Make sure you discuss any questions you have with your health care provider. Document Released: 06/09/2003 Document Revised: 08/30/2015 Document Reviewed: 08/10/2015 Elsevier Interactive Patient Education  2019 [employer].

## 2018-04-25 NOTE — Progress Notes (Signed)
   LOW-RISK PREGNANCY VISIT Patient name: Margaret Pierce MRN [id number]  Date of birth: 27-Sep-1990 Chief Complaint:   No chief complaint on file.  History of Present Illness:   Margaret Pierce is a 28 y.o. G1P0 female at [redacted]w[redacted]d with an Estimated Date of Delivery: 05/31/18 being seen today for ongoing management of a low-risk pregnancy.  Today she reports no complaints. Contractions: Not present.  .  Movement: Present. denies leaking of fluid. Review of Systems:   Pertinent items are noted in HPI Denies abnormal vaginal discharge w/ itching/odor/irritation, headaches, visual changes, shortness of breath, chest pain, abdominal pain, severe nausea/vomiting, or problems with urination or bowel movements unless otherwise stated above. Pertinent History Reviewed:  Reviewed past medical,surgical, social, obstetrical and family history.  Reviewed problem list, medications and allergies. Physical Assessment:   Vitals:   04/25/18 1231  BP: 110/70  Pulse: 100  Weight: 291 lb (132 kg)  Body mass index is 49.95 kg/m.        Physical Examination:   General appearance: Well appearing, and in no distress  Mental status: Alert, oriented to person, place, and time  Skin: Warm & dry  Cardiovascular: Normal heart rate noted  Respiratory: Normal respiratory effort, no distress  Abdomen: Soft, gravid, nontender  Pelvic: Cervical exam deferred         Extremities: Edema: Mild pitting, slight indentation  Fetal Status: Fetal Heart Rate (bpm): 145 Fundal Height: 38 cm Movement: Present    Results for orders placed or performed in visit on 04/25/18 (from the past 24 hour(s))  POC Urinalysis Dipstick OB   Collection Time: 04/25/18 12:39 PM  Result Value Ref Range   Color, UA     Clarity, UA     Glucose, UA Negative Negative   Bilirubin, UA     Ketones, UA neg    Spec Grav, UA     Blood, UA 3    pH, UA     POC,PROTEIN,UA Negative Negative, Trace, Small (1+), Moderate (2+), Large (3+), 4+   Urobilinogen, UA     Nitrite, UA neg    Leukocytes, UA Negative Negative   Appearance     Odor      Assessment & Plan:  1) Low-risk pregnancy G1P0 at [redacted]w[redacted]d with an Estimated Date of Delivery: 05/31/18   2) Hematuria, no vb, uti s/s or stone sx, send cx  3) Uterine size >dates- efw/afi u/s   Meds: No orders of the defined types were placed in this encounter.  Labs/procedures today: urine cx  Plan:  Continue routine obstetrical care   Reviewed: Preterm labor symptoms and general obstetric precautions including but not limited to vaginal bleeding, contractions, leaking of fluid and fetal movement were reviewed in detail with the patient.  All questions were answered  Follow-up: Return for asap efw u/s (no visit), then 2wks LROB.  Orders Placed This Encounter  Procedures  . Urine Culture  . US OB Follow Up  . POC Urinalysis Dipstick OB   Cheral Marker CNM, [facility] 04/25/2018 1:07 PM

## 2018-04-27 LAB — URINE CULTURE

## 2018-05-05 ENCOUNTER — Ambulatory Visit (INDEPENDENT_AMBULATORY_CARE_PROVIDER_SITE_OTHER): Payer: Managed Care, Other (non HMO)

## 2018-05-05 ENCOUNTER — Other Ambulatory Visit: Payer: Self-pay

## 2018-05-05 ENCOUNTER — Ambulatory Visit (INDEPENDENT_AMBULATORY_CARE_PROVIDER_SITE_OTHER): Payer: Managed Care, Other (non HMO) | Admitting: Women's Health

## 2018-05-05 ENCOUNTER — Encounter: Payer: Self-pay | Admitting: Women's Health

## 2018-05-05 VITALS — BP 131/71 | HR 79 | Wt 297.0 lb

## 2018-05-05 DIAGNOSIS — Z1389 Encounter for screening for other disorder: Secondary | ICD-10-CM

## 2018-05-05 DIAGNOSIS — Z3403 Encounter for supervision of normal first pregnancy, third trimester: Secondary | ICD-10-CM

## 2018-05-05 DIAGNOSIS — O26843 Uterine size-date discrepancy, third trimester: Secondary | ICD-10-CM | POA: Diagnosis not present

## 2018-05-05 DIAGNOSIS — Z3A36 36 weeks gestation of pregnancy: Secondary | ICD-10-CM

## 2018-05-05 DIAGNOSIS — Z331 Pregnant state, incidental: Secondary | ICD-10-CM

## 2018-05-05 LAB — POCT URINALYSIS DIPSTICK OB
Glucose, UA: NEGATIVE
KETONES UA: NEGATIVE
Leukocytes, UA: NEGATIVE
Nitrite, UA: NEGATIVE
POC,PROTEIN,UA: NEGATIVE

## 2018-05-05 NOTE — Progress Notes (Signed)
   LOW-RISK PREGNANCY VISIT Patient name: Margaret Pierce MRN [id number]  Date of birth: 1991/01/12 Chief Complaint:   Routine Prenatal Visit (u/s today)  History of Present Illness:   Margaret Pierce is a 28 y.o. G1P0 female at [redacted]w[redacted]d with an Estimated Date of Delivery: 05/31/18 being seen today for ongoing management of a low-risk pregnancy.  Today she reports no complaints. Contractions: Not present. Vag. Bleeding: None.  Movement: Present. denies leaking of fluid. Review of Systems:   Pertinent items are noted in HPI Denies abnormal vaginal discharge w/ itching/odor/irritation, headaches, visual changes, shortness of breath, chest pain, abdominal pain, severe nausea/vomiting, or problems with urination or bowel movements unless otherwise stated above. Pertinent History Reviewed:  Reviewed past medical,surgical, social, obstetrical and family history.  Reviewed problem list, medications and allergies. Physical Assessment:   Vitals:   05/05/18 1159  BP: 131/71  Pulse: 79  Weight: 297 lb (134.7 kg)  Body mass index is 50.98 kg/m.        Physical Examination:   General appearance: Well appearing, and in no distress  Mental status: Alert, oriented to person, place, and time  Skin: Warm & dry  Cardiovascular: Normal heart rate noted  Respiratory: Normal respiratory effort, no distress  Abdomen: Soft, gravid, nontender  Pelvic: Cervical exam deferred         Extremities: Edema: Mild pitting, slight indentation  Fetal Status: Fetal Heart Rate (bpm): 144 u/s   Movement: Present    [place] 36+2 wks,cephalic,fhr 144 bpm,posterior placenta gr 3,afi 15 cm,efw 3527 g 96%  Results for orders placed or performed in visit on 05/05/18 (from the past 24 hour(s))  POC Urinalysis Dipstick OB   Collection Time: 05/05/18 12:00 PM  Result Value Ref Range   Color, UA     Clarity, UA     Glucose, UA Negative Negative   Bilirubin, UA     Ketones, UA neg    Spec Grav, UA     Blood, UA small    pH, UA      POC,PROTEIN,UA Negative Negative, Trace, Small (1+), Moderate (2+), Large (3+), 4+   Urobilinogen, UA     Nitrite, UA neg    Leukocytes, UA Negative Negative   Appearance     Odor      Assessment & Plan:  1) Low-risk pregnancy G1P0 at [redacted]w[redacted]d with an Estimated Date of Delivery: 05/31/18   2) Suspected LGA, EFW 96% today (done for size >dates), pt weighed 9lbs at birth, her brother was 10lbs. FOB was 6lbs. Normal 2hr GTT @ 28wks.    Meds: No orders of the defined types were placed in this encounter.  Labs/procedures today: efw/afi u/s  Plan:  Continue routine obstetrical care   Reviewed: Preterm labor symptoms and general obstetric precautions including but not limited to vaginal bleeding, contractions, leaking of fluid and fetal movement were reviewed in detail with the patient.  All questions were answered  Follow-up: Return in about 1 week (around 05/12/2018) for LROB. and gbs, gc/ct  Orders Placed This Encounter  Procedures  . POC Urinalysis Dipstick OB   Cheral Marker CNM, [facility] 05/05/2018 1:03 PM

## 2018-05-05 NOTE — Progress Notes (Signed)
[place] 36+2 wks,cephalic,fhr 144 bpm,posterior placenta gr 3,afi 15 cm,efw 3527 g 96%

## 2018-05-05 NOTE — Patient Instructions (Signed)
Margaret Pierce, I greatly value your feedback.  If you receive a survey following your visit with [place] today, we appreciate you taking the time to fill it out.  Thanks, Joellyn Haff, CNM, WHNP-BC   Call the office [phone]) or go to [facility] if:  You begin to have strong, frequent contractions  Your water breaks.  Sometimes it is a big gush of fluid, sometimes it is just a trickle that keeps getting your panties wet or running down your legs  You have vaginal bleeding.  It is normal to have a small amount of spotting if your cervix was checked.   You don't feel your baby moving like normal.  If you don't, get you something to eat and drink and lay down and focus on feeling your baby move.  You should feel at least 10 movements in 2 hours.  If you don't, you should call the office or go to [facility].     Preterm Labor and Birth Information  The normal length of a pregnancy is 39-41 weeks. Preterm labor is when labor starts before 37 completed weeks of pregnancy. What are the risk factors for preterm labor? Preterm labor is more likely to occur in women who:  Have certain infections during pregnancy such as a bladder infection, sexually transmitted infection, or infection inside the uterus (chorioamnionitis).  Have a shorter-than-normal cervix.  Have gone into preterm labor before.  Have had surgery on their cervix.  Are younger than age 35 or older than age 78.  Are African American.  Are pregnant with twins or multiple babies (multiple gestation).  Take street drugs or smoke while pregnant.  Do not gain enough weight while pregnant.  Became pregnant shortly after having been pregnant. What are the symptoms of preterm labor? Symptoms of preterm labor include:  Cramps similar to those that can happen during a menstrual period. The cramps may happen with diarrhea.  Pain in the abdomen or lower back.  Regular uterine contractions that may feel like tightening of  the abdomen.  A feeling of increased pressure in the pelvis.  Increased watery or bloody mucus discharge from the vagina.  Water breaking (ruptured amniotic sac). Why is it important to recognize signs of preterm labor? It is important to recognize signs of preterm labor because babies who are born prematurely may not be fully developed. This can put them at an increased risk for:  Long-term (chronic) heart and lung problems.  Difficulty immediately after birth with regulating body systems, including blood sugar, body temperature, heart rate, and breathing rate.  Bleeding in the brain.  Cerebral palsy.  Learning difficulties.  Death. These risks are highest for babies who are born before 34 weeks of pregnancy. How is preterm labor treated? Treatment depends on the length of your pregnancy, your condition, and the health of your baby. It may involve:  Having a stitch (suture) placed in your cervix to prevent your cervix from opening too early (cerclage).  Taking or being given medicines, such as: ? Hormone medicines. These may be given early in pregnancy to help support the pregnancy. ? Medicine to stop contractions. ? Medicines to help mature the baby's lungs. These may be prescribed if the risk of delivery is high. ? Medicines to prevent your baby from developing cerebral palsy. If the labor happens before 34 weeks of pregnancy, you may need to stay in the hospital. What should I do if I think I am in preterm labor? If you think that  you are going into preterm labor, call your health care provider right away. How can I prevent preterm labor in future pregnancies? To increase your chance of having a full-term pregnancy:  Do not use any tobacco products, such as cigarettes, chewing tobacco, and e-cigarettes. If you need help quitting, ask your health care provider.  Do not use street drugs or medicines that have not been prescribed to you during your pregnancy.  Talk with  your health care provider before taking any herbal supplements, even if you have been taking them regularly.  Make sure you gain a healthy amount of weight during your pregnancy.  Watch for infection. If you think that you might have an infection, get it checked right away.  Make sure to tell your health care provider if you have gone into preterm labor before. This information is not intended to replace advice given to you by your health care provider. Make sure you discuss any questions you have with your health care provider. Document Released: 06/09/2003 Document Revised: 08/30/2015 Document Reviewed: 08/10/2015 Elsevier Interactive Patient Education  2019 [employer].

## 2018-05-13 ENCOUNTER — Encounter: Payer: Self-pay | Admitting: Women's Health

## 2018-05-13 ENCOUNTER — Ambulatory Visit (INDEPENDENT_AMBULATORY_CARE_PROVIDER_SITE_OTHER): Payer: Managed Care, Other (non HMO) | Admitting: Women's Health

## 2018-05-13 VITALS — BP 111/71 | HR 84 | Wt 301.0 lb

## 2018-05-13 DIAGNOSIS — Z331 Pregnant state, incidental: Secondary | ICD-10-CM

## 2018-05-13 DIAGNOSIS — Z1389 Encounter for screening for other disorder: Secondary | ICD-10-CM

## 2018-05-13 DIAGNOSIS — Z3483 Encounter for supervision of other normal pregnancy, third trimester: Secondary | ICD-10-CM

## 2018-05-13 DIAGNOSIS — Z3403 Encounter for supervision of normal first pregnancy, third trimester: Secondary | ICD-10-CM

## 2018-05-13 DIAGNOSIS — Z3A37 37 weeks gestation of pregnancy: Secondary | ICD-10-CM

## 2018-05-13 LAB — POCT URINALYSIS DIPSTICK OB
Blood, UA: 2
Glucose, UA: NEGATIVE
KETONES UA: NEGATIVE
Leukocytes, UA: NEGATIVE
Nitrite, UA: NEGATIVE
POC,PROTEIN,UA: NEGATIVE

## 2018-05-13 NOTE — Progress Notes (Signed)
   LOW-RISK PREGNANCY VISIT Patient name: Margaret Pierce MRN [id number]  Date of birth: [birth year] Chief Complaint:   Routine Prenatal Visit (gbs-gc-chl)  History of Present Illness:   Margaret Pierce is a 28 y.o. G1P0 female at [redacted]w[redacted]d with an Estimated Date of Delivery: 05/31/18 being seen today for ongoing management of a low-risk pregnancy.  Today she reports no complaints. Contractions: Irregular.  .  Movement: Present. denies leaking of fluid. Review of Systems:   Pertinent items are noted in HPI Denies abnormal vaginal discharge w/ itching/odor/irritation, headaches, visual changes, shortness of breath, chest pain, abdominal pain, severe nausea/vomiting, or problems with urination or bowel movements unless otherwise stated above. Pertinent History Reviewed:  Reviewed past medical,surgical, social, obstetrical and family history.  Reviewed problem list, medications and allergies. Physical Assessment:   Vitals:   05/13/18 1114  BP: 111/71  Pulse: 84  Weight: (!) 301 lb (136.5 kg)  Body mass index is 51.67 kg/m.        Physical Examination:   General appearance: Well appearing, and in no distress  Mental status: Alert, oriented to person, place, and time  Skin: Warm & dry  Cardiovascular: Normal heart rate noted  Respiratory: Normal respiratory effort, no distress  Abdomen: Soft, gravid, nontender  Pelvic: Cervical exam performed  Dilation: Closed Effacement (%): 50 Station: -2 outer os 1, inner os closed  Extremities: Edema: Mild pitting, slight indentation  Fetal Status: Fetal Heart Rate (bpm): 150 Fundal Height: 39 cm Movement: Present Presentation: Vertex  Results for orders placed or performed in visit on 05/13/18 (from the past 24 hour(s))  POC Urinalysis Dipstick OB   Collection Time: 05/13/18 11:18 AM  Result Value Ref Range   Color, UA     Clarity, UA     Glucose, UA Negative Negative   Bilirubin, UA     Ketones, UA neg    Spec Grav, UA     Blood, UA 2    pH, UA      POC,PROTEIN,UA Negative Negative, Trace, Small (1+), Moderate (2+), Large (3+), 4+   Urobilinogen, UA     Nitrite, UA neg    Leukocytes, UA Negative Negative   Appearance     Odor      Assessment & Plan:  1) Low-risk pregnancy G1P0 at [redacted]w[redacted]d with an Estimated Date of Delivery: 05/31/18    Meds: No orders of the defined types were placed in this encounter.  Labs/procedures today: gbs, gc/ct, sve  Plan:  Continue routine obstetrical care   Reviewed: Term labor symptoms and general obstetric precautions including but not limited to vaginal bleeding, contractions, leaking of fluid and fetal movement were reviewed in detail with the patient.  All questions were answered  Follow-up: Return in about 1 week (around 05/20/2018) for LROB.  Orders Placed This Encounter  Procedures  . Strep Gp B NAA  . GC/Chlamydia Probe Amp  . POC Urinalysis Dipstick OB   Cheral Marker CNM, [facility] 05/13/2018 11:40 AM

## 2018-05-13 NOTE — Patient Instructions (Signed)
Margaret Pierce, I greatly value your feedback.  If you receive a survey following your visit with us today, we appreciate you taking the time to fill it out.  Thanks, Joellyn HaffKim Zyionna Pesce, CNM, WHNP-BC   Call the office [phone]) or go to [facility] if:  You begin to have strong, frequent contractions  Your water breaks.  Sometimes it is a big gush of fluid, sometimes it is just a trickle that keeps getting your panties wet or running down your legs  You have vaginal bleeding.  It is normal to have a small amount of spotting if your cervix was checked.   You don't feel your baby moving like normal.  If you don't, get you something to eat and drink and lay down and focus on feeling your baby move.  You should feel at least 10 movements in 2 hours.  If you don't, you should call the office or go to [facility].     [facility] Contractions Contractions of the uterus can occur throughout pregnancy, but they are not always a sign that you are in labor. You may have practice contractions called Braxton Hicks contractions. These false labor contractions are sometimes confused with true labor. What are Deberah PeltonBraxton Hicks contractions? Braxton Hicks contractions are tightening movements that occur in the muscles of the uterus before labor. Unlike true labor contractions, these contractions do not result in opening (dilation) and thinning of the cervix. Toward the end of pregnancy (32-34 weeks), Braxton Hicks contractions can happen more often and may become stronger. These contractions are sometimes difficult to tell apart from true labor because they can be very uncomfortable. You should not feel embarrassed if you go to the hospital with false labor. Sometimes, the only way to tell if you are in true labor is for your health care provider to look for changes in the cervix. The health care provider will do a physical exam and may monitor your contractions. If you are not in true labor, the exam should  show that your cervix is not dilating and your water has not broken. If there are no other health problems associated with your pregnancy, it is completely safe for you to be sent home with false labor. You may continue to have Braxton Hicks contractions until you go into true labor. How to tell the difference between true labor and false labor True labor  Contractions last 30-70 seconds.  Contractions become very regular.  Discomfort is usually felt in the top of the uterus, and it spreads to the lower abdomen and low back.  Contractions do not go away with walking.  Contractions usually become more intense and increase in frequency.  The cervix dilates and gets thinner. False labor  Contractions are usually shorter and not as strong as true labor contractions.  Contractions are usually irregular.  Contractions are often felt in the front of the lower abdomen and in the groin.  Contractions may go away when you walk around or change positions while lying down.  Contractions get weaker and are shorter-lasting as time goes on.  The cervix usually does not dilate or become thin. Follow these instructions at home:   Take over-the-counter and prescription medicines only as told by your health care provider.  Keep up with your usual exercises and follow other instructions from your health care provider.  Eat and drink lightly if you think you are going into labor.  If Braxton Hicks contractions are making you uncomfortable: ? Change your position  from lying down or resting to walking, or change from walking to resting. ? Sit and rest in a tub of warm water. ? Drink enough fluid to keep your urine pale yellow. Dehydration may cause these contractions. ? Do slow and deep breathing several times an hour.  Keep all follow-up prenatal visits as told by your health care provider. This is important. Contact a health care provider if:  You have a fever.  You have continuous pain  in your abdomen. Get help right away if:  Your contractions become stronger, more regular, and closer together.  You have fluid leaking or gushing from your vagina.  You pass blood-tinged mucus (bloody show).  You have bleeding from your vagina.  You have low back pain that you never had before.  You feel your baby's head pushing down and causing pelvic pressure.  Your baby is not moving inside you as much as it used to. Summary  Contractions that occur before labor are called Braxton Hicks contractions, false labor, or practice contractions.  Braxton Hicks contractions are usually shorter, weaker, farther apart, and less regular than true labor contractions. True labor contractions usually become progressively stronger and regular, and they become more frequent.  Manage discomfort from [facility] contractions by changing position, resting in a warm bath, drinking plenty of water, or practicing deep breathing. This information is not intended to replace advice given to you by your health care provider. Make sure you discuss any questions you have with your health care provider. Document Released: 08/02/2016 Document Revised: 01/01/2017 Document Reviewed: 08/02/2016 Elsevier Interactive Patient Education  2019 [employer].

## 2018-05-15 LAB — STREP GP B NAA: Strep Gp B NAA: POSITIVE — AB

## 2018-05-16 LAB — GC/CHLAMYDIA PROBE AMP
Chlamydia trachomatis, NAA: NEGATIVE
Neisseria gonorrhoeae by PCR: NEGATIVE

## 2018-05-22 ENCOUNTER — Ambulatory Visit (INDEPENDENT_AMBULATORY_CARE_PROVIDER_SITE_OTHER): Payer: Managed Care, Other (non HMO) | Admitting: Women's Health

## 2018-05-22 ENCOUNTER — Encounter: Payer: Self-pay | Admitting: Women's Health

## 2018-05-22 VITALS — BP 131/83 | HR 82 | Wt 310.0 lb

## 2018-05-22 DIAGNOSIS — Z331 Pregnant state, incidental: Secondary | ICD-10-CM

## 2018-05-22 DIAGNOSIS — Z3A38 38 weeks gestation of pregnancy: Secondary | ICD-10-CM

## 2018-05-22 DIAGNOSIS — R81 Glycosuria: Secondary | ICD-10-CM

## 2018-05-22 DIAGNOSIS — Z1389 Encounter for screening for other disorder: Secondary | ICD-10-CM

## 2018-05-22 DIAGNOSIS — Z3403 Encounter for supervision of normal first pregnancy, third trimester: Secondary | ICD-10-CM

## 2018-05-22 LAB — POCT URINALYSIS DIPSTICK OB
KETONES UA: NEGATIVE
Leukocytes, UA: NEGATIVE
Nitrite, UA: NEGATIVE
POC,PROTEIN,UA: NEGATIVE

## 2018-05-22 LAB — GLUCOSE, POCT (MANUAL RESULT ENTRY): POC Glucose: 140 mg/dl — AB (ref 70–99)

## 2018-05-22 NOTE — Progress Notes (Signed)
LOW-RISK PREGNANCY VISIT Patient name: Margaret Pierce MRN [id number]  Date of birth: [birth year] Chief Complaint:   Routine Prenatal Visit (pitting edema; legs get sweaty and feels faint at times )  History of Present Illness:   Margaret Pierce is a 28 y.o. G1P0 female at [redacted]w[redacted]d with an Estimated Date of Delivery: 05/31/18 being seen today for ongoing management of a low-risk pregnancy.  Today she reports increased edema. Sometimes at work legs get sweaty, feels faint.  Denies ha, visual changes, ruq/epigastric pain, n/v.  Ate sausage/egg McMuffin, burrito, mocha frappe this am @ 0730. Contractions: Irregular. Vag. Bleeding: None.  Movement: Present. denies leaking of fluid. Review of Systems:   Pertinent items are noted in HPI Denies abnormal vaginal discharge w/ itching/odor/irritation, headaches, visual changes, shortness of breath, chest pain, abdominal pain, severe nausea/vomiting, or problems with urination or bowel movements unless otherwise stated above. Pertinent History Reviewed:  Reviewed past medical,surgical, social, obstetrical and family history.  Reviewed problem list, medications and allergies. Physical Assessment:   Vitals:   05/22/18 1155  BP: 131/83  Pulse: 82  Weight: (!) 310 lb (140.6 kg)  Body mass index is 53.21 kg/m.        Physical Examination:   General appearance: Well appearing, and in no distress  Mental status: Alert, oriented to person, place, and time  Skin: Warm & dry  Cardiovascular: Normal heart rate noted  Respiratory: Normal respiratory effort, no distress  Abdomen: Soft, gravid, nontender  Pelvic: Cervical exam performed  Dilation: Closed Effacement (%): 50 Station: Ballotable  Extremities: Edema: Mild pitting, slight indentation  Fetal Status: Fetal Heart Rate (bpm): 140 Fundal Height: 40 cm Movement: Present Presentation: Vertex  Results for orders placed or performed in visit on 05/22/18 (from the past 24 hour(s))  POC Urinalysis Dipstick  OB   Collection Time: 05/22/18 11:57 AM  Result Value Ref Range   Color, UA     Clarity, UA     Glucose, UA Trace (A) Negative   Bilirubin, UA     Ketones, UA neg    Spec Grav, UA     Blood, UA 1+    pH, UA     POC,PROTEIN,UA Negative Negative, Trace, Small (1+), Moderate (2+), Large (3+), 4+   Urobilinogen, UA     Nitrite, UA neg    Leukocytes, UA Negative Negative   Appearance     Odor    POCT glucose (manual entry)   Collection Time: 05/22/18 12:44 PM  Result Value Ref Range   POC Glucose 140 (A) 70 - 99 mg/dl    Assessment & Plan:  1) Low-risk pregnancy G1P0 at [redacted]w[redacted]d with an Estimated Date of Delivery: 05/31/18   2) Glucosuria, CBG 140, normal 2hr GTT, discussed w/ LHE, no further intervention needed   3) Suspsected LGA> 96% @ 36wks  4) High-normal bp w/ increased edema today> w/ 9lb wt gain since last visit, asymptomatic, no proteinuria, to check bp's at work, come Monday am for bp check w/ nurse   Meds: No orders of the defined types were placed in this encounter.  Labs/procedures today: cbg, sve  Plan:  Continue routine obstetrical care   Reviewed: Term labor symptoms and general obstetric precautions including but not limited to vaginal bleeding, contractions, leaking of fluid and fetal movement were reviewed in detail with the patient.  All questions were answered  Follow-up: Return for monday am bp check w/ nurse, then Thurs for LROB w/ LHE.  Orders Placed This Encounter  Procedures  . POC Urinalysis Dipstick OB  . POCT glucose (manual entry)   Cheral Marker CNM, [facility] 05/22/2018 2:15 PM

## 2018-05-22 NOTE — Patient Instructions (Addendum)
Margaret Pierce, I greatly value your feedback.  If you receive a survey following your visit with [place] today, we appreciate you taking the time to fill it out.  Thanks, Joellyn Haff, CNM, WHNP-BC   Call the office [phone]) or go to [facility] if:  You begin to have strong, frequent contractions  Your water breaks.  Sometimes it is a big gush of fluid, sometimes it is just a trickle that keeps getting your panties wet or running down your legs  You have vaginal bleeding.  It is normal to have a small amount of spotting if your cervix was checked.  You don't feel your baby moving like normal.  If you don't, get you something to eat and drink and lay down and focus on feeling your baby move.  You should feel at least 10 movements in 2 hours.  If you don't, you should call the office or go to [facility]   For Dizzy Spells:   This is usually related to either your blood sugar or your blood pressure dropping  Make sure you are staying well hydrated and drinking enough water so that your urine is clear  Eat small frequent meals and snacks containing protein (meat, eggs, nuts, cheese) so that your blood sugar doesn't drop  If you do get dizzy, sit/lay down and get you something to drink and a snack containing protein- you will usually start feeling better in 10-20 minutes  Check your blood pressure at work If blood pressure is >=160 on top or >=110 on bottom, check again in 5 minutes, if still this high, call us or go to [facility] to be evaluated     [facility] IS MOVING!!! to [facility] ([address] [place], [place] [address]) on Sunday May 25, 2018 at 5:30am It will be called the [facility], and it is located off of [address]. DO NOT GO TO [address] (the current [facility]) on February 23rd or after, no one will be there!   Braxton Hicks Contractions Contractions of the uterus can occur throughout pregnancy, but  they are not always a sign that you are in labor. You may have practice contractions called Braxton Hicks contractions. These false labor contractions are sometimes confused with true labor. What are Deberah Pelton contractions? Braxton Hicks contractions are tightening movements that occur in the muscles of the uterus before labor. Unlike true labor contractions, these contractions do not result in opening (dilation) and thinning of the cervix. Toward the end of pregnancy (32-34 weeks), Braxton Hicks contractions can happen more often and may become stronger. These contractions are sometimes difficult to tell apart from true labor because they can be very uncomfortable. You should not feel embarrassed if you go to the hospital with false labor. Sometimes, the only way to tell if you are in true labor is for your health care provider to look for changes in the cervix. The health care provider will do a physical exam and may monitor your contractions. If you are not in true labor, the exam should show that your cervix is not dilating and your water has not broken. If there are no other health problems associated with your pregnancy, it is completely safe for you to be sent home with false labor. You may continue to have Braxton Hicks contractions until you go into true labor. How to tell the difference between true labor and false labor True labor  Contractions last  30-70 seconds.  Contractions become very regular.  Discomfort is usually felt in the top of the uterus, and it spreads to the lower abdomen and low back.  Contractions do not go away with walking.  Contractions usually become more intense and increase in frequency.  The cervix dilates and gets thinner. False labor  Contractions are usually shorter and not as strong as true labor contractions.  Contractions are usually irregular.  Contractions are often felt in the front of the lower abdomen and in the groin.  Contractions may go  away when you walk around or change positions while lying down.  Contractions get weaker and are shorter-lasting as time goes on.  The cervix usually does not dilate or become thin. Follow these instructions at home:   Take over-the-counter and prescription medicines only as told by your health care provider.  Keep up with your usual exercises and follow other instructions from your health care provider.  Eat and drink lightly if you think you are going into labor.  If Braxton Hicks contractions are making you uncomfortable: ? Change your position from lying down or resting to walking, or change from walking to resting. ? Sit and rest in a tub of warm water. ? Drink enough fluid to keep your urine pale yellow. Dehydration may cause these contractions. ? Do slow and deep breathing several times an hour.  Keep all follow-up prenatal visits as told by your health care provider. This is important. Contact a health care provider if:  You have a fever.  You have continuous pain in your abdomen. Get help right away if:  Your contractions become stronger, more regular, and closer together.  You have fluid leaking or gushing from your vagina.  You pass blood-tinged mucus (bloody show).  You have bleeding from your vagina.  You have low back pain that you never had before.  You feel your baby's head pushing down and causing pelvic pressure.  Your baby is not moving inside you as much as it used to. Summary  Contractions that occur before labor are called Braxton Hicks contractions, false labor, or practice contractions.  Braxton Hicks contractions are usually shorter, weaker, farther apart, and less regular than true labor contractions. True labor contractions usually become progressively stronger and regular, and they become more frequent.  Manage discomfort from [facility] contractions by changing position, resting in a warm bath, drinking plenty of water, or practicing deep  breathing. This information is not intended to replace advice given to you by your health care provider. Make sure you discuss any questions you have with your health care provider. Document Released: 08/02/2016 Document Revised: 01/01/2017 Document Reviewed: 08/02/2016 Elsevier Interactive Patient Education  2019 [employer].

## 2018-05-26 ENCOUNTER — Ambulatory Visit (INDEPENDENT_AMBULATORY_CARE_PROVIDER_SITE_OTHER): Payer: Managed Care, Other (non HMO) | Admitting: *Deleted

## 2018-05-26 ENCOUNTER — Encounter: Payer: Self-pay | Admitting: *Deleted

## 2018-05-26 VITALS — BP 124/83 | HR 94 | Wt 312.8 lb

## 2018-05-26 DIAGNOSIS — Z331 Pregnant state, incidental: Secondary | ICD-10-CM

## 2018-05-26 DIAGNOSIS — Z1389 Encounter for screening for other disorder: Secondary | ICD-10-CM

## 2018-05-26 LAB — POCT URINALYSIS DIPSTICK OB
Blood, UA: NEGATIVE
KETONES UA: NEGATIVE
Leukocytes, UA: NEGATIVE
Nitrite, UA: NEGATIVE
POC,PROTEIN,UA: NEGATIVE

## 2018-05-26 NOTE — Progress Notes (Signed)
Pt in for bp check. Baby is moving well. No protein in urine. Advised patient to followup as scheduled and continue taking bp at home. If high readings patient to call us.

## 2018-05-28 ENCOUNTER — Telehealth: Payer: Self-pay | Admitting: Advanced Practice Midwife

## 2018-05-28 NOTE — Telephone Encounter (Signed)
Please call pt she has appt tomorrow and wants to talk to someone about being worked in earlier

## 2018-05-28 NOTE — Telephone Encounter (Signed)
Patient called stating she is congested and wants to be seen sooner than tomorrow.  Checked her BP and it was elevated but is better now. She is miserable and is ready to be induced.  Informed patients we did not do elective inductions but could discuss with Dr Despina Hidden at her appt. Says her feet are very swollen and is having a hard time getting her shoes on.  Advised I would move her appt up from tomorrow afternoon to the morning.  Pt agreeable to plan.

## 2018-05-29 ENCOUNTER — Ambulatory Visit (INDEPENDENT_AMBULATORY_CARE_PROVIDER_SITE_OTHER): Payer: Managed Care, Other (non HMO) | Admitting: Obstetrics & Gynecology

## 2018-05-29 ENCOUNTER — Other Ambulatory Visit: Payer: Self-pay

## 2018-05-29 ENCOUNTER — Encounter (HOSPITAL_COMMUNITY): Payer: Self-pay | Admitting: *Deleted

## 2018-05-29 ENCOUNTER — Encounter: Payer: Self-pay | Admitting: Obstetrics & Gynecology

## 2018-05-29 VITALS — BP 120/67 | HR 88 | Wt 315.0 lb

## 2018-05-29 DIAGNOSIS — Z3A39 39 weeks gestation of pregnancy: Secondary | ICD-10-CM

## 2018-05-29 DIAGNOSIS — Z3403 Encounter for supervision of normal first pregnancy, third trimester: Secondary | ICD-10-CM | POA: Diagnosis not present

## 2018-05-29 DIAGNOSIS — Z1389 Encounter for screening for other disorder: Secondary | ICD-10-CM

## 2018-05-29 DIAGNOSIS — Z331 Pregnant state, incidental: Secondary | ICD-10-CM

## 2018-05-29 LAB — POCT URINALYSIS DIPSTICK OB
Blood, UA: NEGATIVE
Glucose, UA: NEGATIVE
KETONES UA: NEGATIVE
Leukocytes, UA: NEGATIVE
Nitrite, UA: NEGATIVE
POC,PROTEIN,UA: NEGATIVE

## 2018-05-29 NOTE — Progress Notes (Signed)
Patient ID: Margaret Pierce, female   DOB: Oct 08, 1990, 28 y.o.   MRN: [id number]   LOW-RISK PREGNANCY VISIT Patient name: TARRI Pierce MRN [id number]  Date of birth: [birth year] Chief Complaint:   Routine Prenatal Visit  History of Present Illness:   Margaret Pierce is a 28 y.o. G1P0 female at [redacted]w[redacted]d with an Estimated Date of Delivery: 05/31/18 being seen today for ongoing management of a low-risk pregnancy.  Today she reports no complaints. Contractions: Irregular. Vag. Bleeding: None.  Movement: Present. denies leaking of fluid. Review of Systems:   Pertinent items are noted in HPI Denies abnormal vaginal discharge w/ itching/odor/irritation, headaches, visual changes, shortness of breath, chest pain, abdominal pain, severe nausea/vomiting, or problems with urination or bowel movements unless otherwise stated above. Pertinent History Reviewed:  Reviewed past medical,surgical, social, obstetrical and family history.  Reviewed problem list, medications and allergies. Physical Assessment:   Vitals:   05/29/18 0852  BP: 120/67  Pulse: 88  Weight: (!) 315 lb (142.9 kg)  Body mass index is 54.07 kg/m.        Physical Examination:   General appearance: Well appearing, and in no distress  Mental status: Alert, oriented to person, place, and time  Skin: Warm & dry  Cardiovascular: Normal heart rate noted  Respiratory: Normal respiratory effort, no distress  Abdomen: Soft, gravid, nontender  Pelvic: Cervical exam performed  Dilation: Closed Effacement (%): 50 Station: BallotableLTC  Extremities: Edema: Mild pitting, slight indentation  Fetal Status: Fetal Heart Rate (bpm): 144 Fundal Height: 44 cm Movement: Present Presentation: Vertex  Results for orders placed or performed in visit on 05/29/18 (from the past 24 hour(s))  POC Urinalysis Dipstick OB   Collection Time: 05/29/18  8:52 AM  Result Value Ref Range   Color, UA     Clarity, UA     Glucose, UA Negative Negative   Bilirubin, UA      Ketones, UA neg    Spec Grav, UA     Blood, UA neg    pH, UA     POC,PROTEIN,UA Negative Negative, Trace, Small (1+), Moderate (2+), Large (3+), 4+   Urobilinogen, UA     Nitrite, UA neg    Leukocytes, UA Negative Negative   Appearance     Odor      Assessment & Plan:  1) Low-risk pregnancy G1P0 at [redacted]w[redacted]d with an Estimated Date of Delivery: 05/31/18   2) Suspected fetal macrosomia with fetal vertex oblique and out of pelvis, discussed at length with pt FOB decided to proceed with primary Caesarean    Meds: No orders of the defined types were placed in this encounter.  Labs/procedures today:   Plan:  Continue routine obstetrical care proceed with primary Caesarean section tomorrow  Reviewed: Term labor symptoms and general obstetric precautions including but not limited to vaginal bleeding, contractions, leaking of fluid and fetal movement were reviewed in detail with the patient.  All questions were answered  Follow-up: Return in about 8 days (around 06/06/2018) for Post Op, with Dr Despina Hidden.  Orders Placed This Encounter  Procedures  . POC Urinalysis Dipstick OB   Lazaro Arms  05/29/2018 9:41 AM

## 2018-05-29 NOTE — Anesthesia Preprocedure Evaluation (Addendum)
Anesthesia Evaluation  Patient identified by MRN, date of birth, ID band Patient awake    Reviewed: Allergy & Precautions, NPO status , Patient's Chart, lab work & pertinent test results  Airway Mallampati: II  TM Distance: >3 FB Neck ROM: Full    Dental no notable dental hx. (+) Teeth Intact   Pulmonary former smoker,    Pulmonary exam normal breath sounds clear to auscultation       Cardiovascular negative cardio ROS Normal cardiovascular exam Rhythm:Regular Rate:Normal     Neuro/Psych negative psych ROS   GI/Hepatic Neg liver ROS, GERD  Medicated,  Endo/Other  Morbid obesity  Renal/GU negative Renal ROS     Musculoskeletal   Abdominal   Peds  Hematology   Anesthesia Other Findings   Reproductive/Obstetrics (+) Pregnancy                            Lab Results  Component Value Date   WBC 13.6 (H) 05/30/2018   HGB 12.4 05/30/2018   HCT 38.6 05/30/2018   MCV 93.2 05/30/2018   PLT 161 05/30/2018    Anesthesia Physical Anesthesia Plan  ASA: IV  Anesthesia Plan: Spinal   Post-op Pain Management:    Induction:   PONV Risk Score and Plan: 2 and Treatment may vary due to age or medical condition, Ondansetron and Dexamethasone  Airway Management Planned: Natural Airway and Nasal Cannula  Additional Equipment:   Intra-op Plan:   Post-operative Plan:   Informed Consent: I have reviewed the patients History and Physical, chart, labs and discussed the procedure including the risks, benefits and alternatives for the proposed anesthesia with the patient or authorized representative who has indicated his/her understanding and acceptance.     Dental advisory given  Plan Discussed with: CRNA  Anesthesia Plan Comments: (G1P0 w inc BMI for primary C/s for fetal macrosomia under spinal)       Anesthesia Quick Evaluation

## 2018-05-29 NOTE — Patient Instructions (Signed)
Margaret Pierce  05/29/2018   Your procedure is scheduled on:  05/30/2018  Arrive at 0530 at Entrance C on [address] at [facility]. You are invited to use the FREE valet parking or use the Visitor's parking deck.  Pick up the phone at the desk and dial [phone].  Call this number if you have problems the morning of surgery: [phone]  Remember:   Do not eat food:(After Midnight) Desps de medianoche.  Do not drink clear liquids: (After Midnight) Desps de medianoche.  Take these medicines the morning of surgery with A SIP OF WATER:  May take prilosec and zyrtec on the morning of surgery   Do not wear jewelry, make-up or nail polish.  Do not wear lotions, powders, or perfumes. Do not wear deodorant.  Do not shave 48 hours prior to surgery.  Do not bring valuables to the hospital.  [facility] is not   responsible for any belongings or valuables brought to the hospital.  Contacts, dentures or bridgework may not be worn into surgery.  Leave suitcase in the car. After surgery it may be brought to your room.  For patients admitted to the hospital, checkout time is 11:00 AM the day of              discharge.      Please read over the following fact sheets that you were given:     Preparing for Surgery

## 2018-05-30 ENCOUNTER — Other Ambulatory Visit: Payer: Self-pay

## 2018-05-30 ENCOUNTER — Encounter (HOSPITAL_COMMUNITY)
Admission: AD | Disposition: A | Discharge status: Home / Self Care | Payer: Self-pay | Source: Home / Self Care | Attending: Obstetrics & Gynecology

## 2018-05-30 ENCOUNTER — Encounter (HOSPITAL_COMMUNITY): Payer: Self-pay | Admitting: *Deleted

## 2018-05-30 ENCOUNTER — Inpatient Hospital Stay (HOSPITAL_COMMUNITY)
Admission: AD | Admit: 2018-05-30 | Discharge: 2018-06-01 | DRG: 788 | Disposition: A | Discharge status: Home / Self Care | Payer: Managed Care, Other (non HMO) | Attending: Obstetrics & Gynecology | Admitting: Obstetrics & Gynecology

## 2018-05-30 ENCOUNTER — Inpatient Hospital Stay (HOSPITAL_COMMUNITY): Payer: Managed Care, Other (non HMO) | Admitting: Anesthesiology

## 2018-05-30 DIAGNOSIS — O99214 Obesity complicating childbirth: Secondary | ICD-10-CM | POA: Diagnosis not present

## 2018-05-30 DIAGNOSIS — Z3A39 39 weeks gestation of pregnancy: Secondary | ICD-10-CM

## 2018-05-30 DIAGNOSIS — O9962 Diseases of the digestive system complicating childbirth: Secondary | ICD-10-CM | POA: Diagnosis not present

## 2018-05-30 DIAGNOSIS — O99824 Streptococcus B carrier state complicating childbirth: Secondary | ICD-10-CM | POA: Diagnosis present

## 2018-05-30 DIAGNOSIS — O322XX0 Maternal care for transverse and oblique lie, not applicable or unspecified: Secondary | ICD-10-CM | POA: Diagnosis present

## 2018-05-30 DIAGNOSIS — O3663X0 Maternal care for excessive fetal growth, third trimester, not applicable or unspecified: Secondary | ICD-10-CM | POA: Diagnosis not present

## 2018-05-30 DIAGNOSIS — Z87891 Personal history of nicotine dependence: Secondary | ICD-10-CM

## 2018-05-30 DIAGNOSIS — O3660X0 Maternal care for excessive fetal growth, unspecified trimester, not applicable or unspecified: Secondary | ICD-10-CM | POA: Diagnosis present

## 2018-05-30 DIAGNOSIS — K589 Irritable bowel syndrome without diarrhea: Secondary | ICD-10-CM | POA: Diagnosis present

## 2018-05-30 DIAGNOSIS — Z3403 Encounter for supervision of normal first pregnancy, third trimester: Secondary | ICD-10-CM

## 2018-05-30 DIAGNOSIS — B951 Streptococcus, group B, as the cause of diseases classified elsewhere: Secondary | ICD-10-CM | POA: Diagnosis present

## 2018-05-30 LAB — CBC
HCT: 36.4 % (ref 36.0–46.0)
HEMATOCRIT: 38.6 % (ref 36.0–46.0)
Hemoglobin: 12.4 g/dL (ref 12.0–15.0)
Hemoglobin: 12.1 g/dL (ref 12.0–15.0)
MCH: 30 pg (ref 26.0–34.0)
MCH: 30.8 pg (ref 26.0–34.0)
MCHC: 32.1 g/dL (ref 30.0–36.0)
MCHC: 33.2 g/dL (ref 30.0–36.0)
MCV: 93.2 fL (ref 80.0–100.0)
MCV: 92.6 fL (ref 80.0–100.0)
Platelets: 161 10*3/uL (ref 150–400)
Platelets: 158 10*3/uL (ref 150–400)
RBC: 4.14 MIL/uL (ref 3.87–5.11)
RBC: 3.93 MIL/uL (ref 3.87–5.11)
RDW: 13.5 % (ref 11.5–15.5)
RDW: 13.4 % (ref 11.5–15.5)
WBC: 13.6 10*3/uL — ABNORMAL HIGH (ref 4.0–10.5)
WBC: 16.4 10*3/uL — ABNORMAL HIGH (ref 4.0–10.5)
nRBC: 0 % (ref 0.0–0.2)
nRBC: 0 % (ref 0.0–0.2)

## 2018-05-30 LAB — TYPE AND SCREEN
ABO/RH(D): A POS
Antibody Screen: NEGATIVE

## 2018-05-30 LAB — ABO/RH: ABO/RH(D): A POS

## 2018-05-30 LAB — CREATININE, SERUM
Creatinine, Ser: 0.55 mg/dL (ref 0.44–1.00)
GFR calc Af Amer: >60 mL/min (ref >60)
GFR calc non Af Amer: >60 mL/min (ref >60)

## 2018-05-30 LAB — RPR: RPR Ser Ql: NONREACTIVE

## 2018-05-30 SURGERY — Surgical Case
Anesthesia: Spinal

## 2018-05-30 MED ORDER — KETOROLAC TROMETHAMINE 30 MG/ML IJ SOLN: 30.0000 mg | Freq: Four times a day (QID) | INTRAMUSCULAR | Status: DC | PRN

## 2018-05-30 MED ORDER — MENTHOL 3 MG MT LOZG: 1.0000 | LOZENGE | OROMUCOSAL | Status: DC | PRN

## 2018-05-30 MED ORDER — HYDROMORPHONE HCL 1 MG/ML IJ SOLN
INTRAMUSCULAR | Status: AC
  Filled 2018-05-30: qty 1

## 2018-05-30 MED ORDER — MEPERIDINE HCL 25 MG/ML IJ SOLN: 6.2500 mg | INTRAMUSCULAR | Status: DC | PRN

## 2018-05-30 MED ORDER — DIPHENHYDRAMINE HCL 25 MG PO CAPS: 25.0000 mg | ORAL_CAPSULE | ORAL | Status: DC | PRN

## 2018-05-30 MED ORDER — KETOROLAC TROMETHAMINE 30 MG/ML IJ SOLN
30.0000 mg | Freq: Four times a day (QID) | INTRAMUSCULAR | Status: DC | PRN
  Administered 2018-05-30: 30 mg via INTRAMUSCULAR

## 2018-05-30 MED ORDER — SIMETHICONE 80 MG PO CHEW
80.0000 mg | CHEWABLE_TABLET | ORAL | Status: DC
  Administered 2018-05-30 – 2018-05-31 (×2): 80 mg via ORAL
  Filled 2018-05-30 (×2): qty 1

## 2018-05-30 MED ORDER — ONDANSETRON HCL 4 MG/2ML IJ SOLN: 4.0000 mg | Freq: Three times a day (TID) | INTRAMUSCULAR | Status: DC | PRN

## 2018-05-30 MED ORDER — SIMETHICONE 80 MG PO CHEW
80.0000 mg | CHEWABLE_TABLET | Freq: Three times a day (TID) | ORAL | Status: DC
  Administered 2018-05-30 – 2018-06-01 (×4): 80 mg via ORAL
  Filled 2018-05-30 (×4): qty 1

## 2018-05-30 MED ORDER — NALBUPHINE HCL 10 MG/ML IJ SOLN: 5.0000 mg | INTRAMUSCULAR | Status: DC | PRN

## 2018-05-30 MED ORDER — FENTANYL CITRATE (PF) 100 MCG/2ML IJ SOLN
INTRAMUSCULAR | Status: DC | PRN
  Administered 2018-05-30: 15 ug via INTRATHECAL

## 2018-05-30 MED ORDER — ONDANSETRON HCL 4 MG/2ML IJ SOLN
INTRAMUSCULAR | Status: AC
  Filled 2018-05-30: qty 2

## 2018-05-30 MED ORDER — IBUPROFEN 800 MG PO TABS
800.0000 mg | ORAL_TABLET | Freq: Three times a day (TID) | ORAL | Status: DC
  Administered 2018-05-30 – 2018-06-01 (×7): 800 mg via ORAL
  Filled 2018-05-30 (×7): qty 1

## 2018-05-30 MED ORDER — COCONUT OIL OIL: 1.0000 "application " | TOPICAL_OIL | Status: DC | PRN

## 2018-05-30 MED ORDER — FENTANYL CITRATE (PF) 100 MCG/2ML IJ SOLN
INTRAMUSCULAR | Status: AC
  Filled 2018-05-30: qty 2

## 2018-05-30 MED ORDER — DIPHENHYDRAMINE HCL 50 MG/ML IJ SOLN: 12.5000 mg | INTRAMUSCULAR | Status: DC | PRN

## 2018-05-30 MED ORDER — GENTAMICIN SULFATE 40 MG/ML IJ SOLN
5.0000 mg/kg | INTRAVENOUS | Status: AC
  Administered 2018-05-30: 450 mg via INTRAVENOUS
  Filled 2018-05-30: qty 11.25

## 2018-05-30 MED ORDER — MORPHINE SULFATE (PF) 0.5 MG/ML IJ SOLN
INTRAMUSCULAR | Status: DC | PRN
  Administered 2018-05-30: .15 mg via INTRATHECAL

## 2018-05-30 MED ORDER — DIPHENHYDRAMINE HCL 25 MG PO CAPS
25.0000 mg | ORAL_CAPSULE | Freq: Four times a day (QID) | ORAL | Status: DC | PRN
  Administered 2018-05-30: 25 mg via ORAL
  Filled 2018-05-30: qty 1

## 2018-05-30 MED ORDER — OXYTOCIN 10 UNIT/ML IJ SOLN
INTRAMUSCULAR | Status: AC
  Filled 2018-05-30: qty 4

## 2018-05-30 MED ORDER — PHENYLEPHRINE HCL-NACL 20-0.9 MG/250ML-% IV SOLN
INTRAVENOUS | Status: DC | PRN
  Administered 2018-05-30: 60 ug/min via INTRAVENOUS

## 2018-05-30 MED ORDER — WITCH HAZEL-GLYCERIN EX PADS: 1.0000 "application " | MEDICATED_PAD | CUTANEOUS | Status: DC | PRN

## 2018-05-30 MED ORDER — NALOXONE HCL 4 MG/10ML IJ SOLN
1.0000 ug/kg/h | INTRAVENOUS | Status: DC | PRN
  Filled 2018-05-30: qty 5

## 2018-05-30 MED ORDER — HYDROMORPHONE HCL 1 MG/ML IJ SOLN
0.2500 mg | INTRAMUSCULAR | Status: DC | PRN
  Administered 2018-05-30: 0.5 mg via INTRAVENOUS
  Administered 2018-05-30: 0.25 mg via INTRAVENOUS

## 2018-05-30 MED ORDER — HYDROCODONE-ACETAMINOPHEN 7.5-325 MG PO TABS: 1.0000 | ORAL_TABLET | Freq: Once | ORAL | Status: DC | PRN

## 2018-05-30 MED ORDER — NALBUPHINE HCL 10 MG/ML IJ SOLN: 5.0000 mg | Freq: Once | INTRAMUSCULAR | Status: DC | PRN

## 2018-05-30 MED ORDER — SODIUM CHLORIDE 0.9% FLUSH: 3.0000 mL | INTRAVENOUS | Status: DC | PRN

## 2018-05-30 MED ORDER — ACETAMINOPHEN 325 MG PO TABS
650.0000 mg | ORAL_TABLET | Freq: Four times a day (QID) | ORAL | Status: DC | PRN
  Administered 2018-05-30 – 2018-06-01 (×6): 650 mg via ORAL
  Filled 2018-05-30 (×6): qty 2

## 2018-05-30 MED ORDER — CLINDAMYCIN PHOSPHATE 900 MG/50ML IV SOLN
900.0000 mg | INTRAVENOUS | Status: AC
  Administered 2018-05-30: 900 mg via INTRAVENOUS
  Filled 2018-05-30: qty 50

## 2018-05-30 MED ORDER — SIMETHICONE 80 MG PO CHEW: 80.0000 mg | CHEWABLE_TABLET | ORAL | Status: DC | PRN

## 2018-05-30 MED ORDER — LACTATED RINGERS IV SOLN: INTRAVENOUS | Status: DC

## 2018-05-30 MED ORDER — CLINDAMYCIN PHOSPHATE 900 MG/50ML IV SOLN: 900.0000 mg | INTRAVENOUS | Status: DC

## 2018-05-30 MED ORDER — SODIUM CHLORIDE 0.9 % IV SOLN
INTRAVENOUS | Status: DC | PRN
  Administered 2018-05-30: 08:00:00 via INTRAVENOUS

## 2018-05-30 MED ORDER — ZOLPIDEM TARTRATE 5 MG PO TABS: 5.0000 mg | ORAL_TABLET | Freq: Every evening | ORAL | Status: DC | PRN

## 2018-05-30 MED ORDER — ONDANSETRON HCL 4 MG/2ML IJ SOLN
INTRAMUSCULAR | Status: DC | PRN
  Administered 2018-05-30: 4 mg via INTRAVENOUS

## 2018-05-30 MED ORDER — DIBUCAINE 1 % RE OINT: 1.0000 "application " | TOPICAL_OINTMENT | RECTAL | Status: DC | PRN

## 2018-05-30 MED ORDER — SCOPOLAMINE 1 MG/3DAYS TD PT72
1.0000 | MEDICATED_PATCH | Freq: Once | TRANSDERMAL | Status: DC
  Administered 2018-05-30: 1.5 mg via TRANSDERMAL

## 2018-05-30 MED ORDER — PHENYLEPHRINE HCL-NACL 20-0.9 MG/250ML-% IV SOLN
INTRAVENOUS | Status: AC
  Filled 2018-05-30: qty 250

## 2018-05-30 MED ORDER — KETOROLAC TROMETHAMINE 30 MG/ML IJ SOLN
INTRAMUSCULAR | Status: AC
  Filled 2018-05-30: qty 1

## 2018-05-30 MED ORDER — OXYCODONE HCL 5 MG PO TABS
5.0000 mg | ORAL_TABLET | ORAL | Status: DC | PRN
  Administered 2018-05-31 – 2018-06-01 (×6): 5 mg via ORAL
  Filled 2018-05-30 (×6): qty 1

## 2018-05-30 MED ORDER — BUPIVACAINE IN DEXTROSE 0.75-8.25 % IT SOLN
INTRATHECAL | Status: DC | PRN
  Administered 2018-05-30: 1.6 mL via INTRATHECAL

## 2018-05-30 MED ORDER — SENNOSIDES-DOCUSATE SODIUM 8.6-50 MG PO TABS
2.0000 | ORAL_TABLET | ORAL | Status: DC
  Administered 2018-05-30 – 2018-05-31 (×2): 2 via ORAL
  Filled 2018-05-30 (×2): qty 2

## 2018-05-30 MED ORDER — SCOPOLAMINE 1 MG/3DAYS TD PT72
MEDICATED_PATCH | TRANSDERMAL | Status: AC
  Filled 2018-05-30: qty 1

## 2018-05-30 MED ORDER — FENTANYL CITRATE (PF) 100 MCG/2ML IJ SOLN: INTRAMUSCULAR | Status: DC | PRN

## 2018-05-30 MED ORDER — PRENATAL MULTIVITAMIN CH
1.0000 | ORAL_TABLET | Freq: Every day | ORAL | Status: DC
  Administered 2018-05-30 – 2018-06-01 (×3): 1 via ORAL
  Filled 2018-05-30 (×3): qty 1

## 2018-05-30 MED ORDER — ACETAMINOPHEN 10 MG/ML IV SOLN: 1000.0000 mg | Freq: Once | INTRAVENOUS | Status: DC | PRN

## 2018-05-30 MED ORDER — OXYTOCIN 40 UNITS IN NORMAL SALINE INFUSION - SIMPLE MED
2.5000 [IU]/h | INTRAVENOUS | Status: AC
  Administered 2018-05-30: 2.5 [IU]/h via INTRAVENOUS

## 2018-05-30 MED ORDER — MORPHINE SULFATE (PF) 0.5 MG/ML IJ SOLN
INTRAMUSCULAR | Status: AC
  Filled 2018-05-30: qty 10

## 2018-05-30 MED ORDER — ENOXAPARIN SODIUM 80 MG/0.8ML ~~LOC~~ SOLN
0.5000 mg/kg | SUBCUTANEOUS | Status: DC
  Administered 2018-05-30 – 2018-05-31 (×2): 70 mg via SUBCUTANEOUS
  Filled 2018-05-30 (×2): qty 0.8

## 2018-05-30 MED ORDER — GENTAMICIN SULFATE 40 MG/ML IJ SOLN: 5.0000 mg/kg | INTRAVENOUS | Status: DC

## 2018-05-30 MED ORDER — OXYTOCIN 10 UNIT/ML IJ SOLN
INTRAVENOUS | Status: DC | PRN
  Administered 2018-05-30: 40 [IU] via INTRAVENOUS

## 2018-05-30 MED ORDER — LACTATED RINGERS IV SOLN
INTRAVENOUS | Status: DC | PRN
  Administered 2018-05-30 (×2): via INTRAVENOUS

## 2018-05-30 MED ORDER — NALOXONE HCL 0.4 MG/ML IJ SOLN: 0.4000 mg | INTRAMUSCULAR | Status: DC | PRN

## 2018-05-30 SURGICAL SUPPLY — 39 items
CHLORAPREP W/TINT 26ML (MISCELLANEOUS) ×2 IMPLANT
CLAMP CORD UMBIL (MISCELLANEOUS) IMPLANT
CLOTH BEACON ORANGE TIMEOUT ST (SAFETY) ×2 IMPLANT
DERMABOND ADHESIVE PROPEN (GAUZE/BANDAGES/DRESSINGS) ×2
DERMABOND ADVANCED (GAUZE/BANDAGES/DRESSINGS) ×2
DERMABOND ADVANCED .7 DNX12 (GAUZE/BANDAGES/DRESSINGS) ×2 IMPLANT
DERMABOND ADVANCED .7 DNX6 (GAUZE/BANDAGES/DRESSINGS) ×2 IMPLANT
DRSG OPSITE POSTOP 4X10 (GAUZE/BANDAGES/DRESSINGS) ×2 IMPLANT
ELECT REM PT RETURN 9FT ADLT (ELECTROSURGICAL) ×2
ELECTRODE REM PT RTRN 9FT ADLT (ELECTROSURGICAL) ×1 IMPLANT
EXTRACTOR VACUUM BELL STYLE (SUCTIONS) IMPLANT
GLOVE BIOGEL PI IND STRL 7.0 (GLOVE) ×1 IMPLANT
GLOVE BIOGEL PI IND STRL 8 (GLOVE) ×1 IMPLANT
GLOVE BIOGEL PI INDICATOR 7.0 (GLOVE) ×1
GLOVE BIOGEL PI INDICATOR 8 (GLOVE) ×1
GLOVE ECLIPSE 8.0 STRL XLNG CF (GLOVE) ×2 IMPLANT
GOWN STRL REUS W/TWL LRG LVL3 (GOWN DISPOSABLE) ×4 IMPLANT
KIT ABG SYR 3ML LUER SLIP (SYRINGE) ×2 IMPLANT
NEEDLE HYPO 18GX1.5 BLUNT FILL (NEEDLE) ×2 IMPLANT
NEEDLE HYPO 22GX1.5 SAFETY (NEEDLE) ×2 IMPLANT
NEEDLE HYPO 25X5/8 SAFETYGLIDE (NEEDLE) ×2 IMPLANT
NS IRRIG 1000ML POUR BTL (IV SOLUTION) ×2 IMPLANT
PACK C SECTION WH (CUSTOM PROCEDURE TRAY) ×2 IMPLANT
PAD OB MATERNITY 4.3X12.25 (PERSONAL CARE ITEMS) ×2 IMPLANT
PENCIL SMOKE EVAC W/HOLSTER (ELECTROSURGICAL) ×2 IMPLANT
RTRCTR C-SECT PINK 25CM LRG (MISCELLANEOUS) IMPLANT
SUT CHROMIC 0 CT 1 (SUTURE) ×2 IMPLANT
SUT MNCRL 0 VIOLET CTX 36 (SUTURE) ×2 IMPLANT
SUT MONOCRYL 0 CTX 36 (SUTURE) ×2
SUT PLAIN 2 0 (SUTURE)
SUT PLAIN 2 0 XLH (SUTURE) IMPLANT
SUT PLAIN ABS 2-0 CT1 27XMFL (SUTURE) IMPLANT
SUT VIC AB 0 CTX 36 (SUTURE) ×1
SUT VIC AB 0 CTX36XBRD ANBCTRL (SUTURE) ×1 IMPLANT
SUT VIC AB 4-0 KS 27 (SUTURE) IMPLANT
SYR 20CC LL (SYRINGE) ×4 IMPLANT
TOWEL OR 17X24 6PK STRL BLUE (TOWEL DISPOSABLE) ×2 IMPLANT
TRAY FOLEY W/BAG SLVR 14FR LF (SET/KITS/TRAYS/PACK) IMPLANT
WATER STERILE IRR 1000ML POUR (IV SOLUTION) ×2 IMPLANT

## 2018-05-30 NOTE — Discharge Summary (Signed)
Obstetrics Discharge Summary OB/GYN Faculty Practice   Patient Name: Margaret Pierce DOB: 11/28/1990 MRN: [id number]  Date of admission: 05/30/2018 Delivering MD: Lazaro Arms   Date of discharge: 06/01/2018  Admitting diagnosis: fetal macrocomia Intrauterine pregnancy: [redacted]w[redacted]d     Secondary diagnosis:   Active Problems:   Cesarean delivery delivered   LGA (large for gestational age) fetus affecting management of mother   Positive GBS test    Discharge diagnosis: Term pregnancy delivered by Cesarean                                        Postpartum procedures: None  Complications: None  Outpatient Follow-Up: [ ]  incision check [ ]  Rx for POPs  Hospital course: Margaret Pierce is a 28 y.o. [redacted]w[redacted]d who was admitted for primary LTCS for fetal macrosomia. Her pregnancy was complicated by IBS, fetal macrosomia. Delivery was uncomplicated, routine blood loss. Please see delivery/op note for additional details. Her postpartum course was uncomplicated. She was breastfeeding without difficulty. By day of discharge, she was passing flatus, urinating, eating and drinking without difficulty. Her pain was well-controlled, and she was discharged home with oxycodone and ibuprofen. She was also discharged home with Lasix 20mg  x 3 days to help with swelling. She has an outpatient incision check scheduled in 1 week. She will follow-up in clinic in 4-6 weeks for routine postpartum visit.   Physical exam  Vitals:   05/31/18 0020 05/31/18 0630 05/31/18 1354 05/31/18 2149  BP: 116/65 107/62 129/72 121/68  Pulse: 75 79 71 78  Resp: 16 16 18 18   Temp: 98.5 F (36.9 C) 97.7 F (36.5 C) 98 F (36.7 C) 98.4 F (36.9 C)  TempSrc: Oral Oral Oral Oral  SpO2: 98% 98% 100%   Weight:      Height:       General: well-appearing, NAD Lochia: appropriate Uterine Fundus: firm Incision: Dressing is clean, dry, and intact DVT Evaluation: trace LE edema  Labs: Lab Results  Component Value Date   WBC 12.7 (H)  05/31/2018   HGB 11.2 (L) 05/31/2018   HCT 34.6 (L) 05/31/2018   MCV 93.8 05/31/2018   PLT 148 (L) 05/31/2018   CMP Latest Ref Rng & Units 05/30/2018  Glucose 70 - 99 mg/dL -  BUN 6 - 20 mg/dL -  Creatinine 7.25 - 3.66 mg/dL 4.40  Sodium 347 - 425 mmol/L -  Potassium 3.5 - 5.1 mmol/L -  Chloride 98 - 111 mmol/L -  CO2 22 - 32 mmol/L -  Calcium 8.9 - 10.3 mg/dL -  Total Protein 6.5 - 8.1 g/dL -  Total Bilirubin 0.3 - 1.2 mg/dL -  Alkaline Phos 38 - 956 U/L -  AST 15 - 41 U/L -  ALT 0 - 44 U/L -    Discharge instructions: Per After Visit Summary and "Baby and Me Booklet"  After visit meds:  Allergies as of 06/01/2018      Reactions   Cefpodoxime Hives, Rash   Vantin as a child Has taken and tolerated Amoxil since then   Codeine Nausea And Vomiting      Medication List    TAKE these medications   acetaminophen 500 MG tablet Commonly known as:  TYLENOL Take 500-1,000 mg by mouth every 6 (six) hours as needed (for pain.).   cetirizine 10 MG tablet Commonly known as:  ZYRTEC Take 10 mg by mouth  daily.   furosemide 20 MG tablet Commonly known as:  LASIX Take 1 tablet (20 mg total) by mouth daily for 3 days. For swelling   ibuprofen 800 MG tablet Commonly known as:  ADVIL,MOTRIN Take 1 tablet (800 mg total) by mouth every 8 (eight) hours.   omeprazole 20 MG capsule Commonly known as:  PRILOSEC Take 1 capsule (20 mg total) by mouth daily.   oxyCODONE 5 MG immediate release tablet Commonly known as:  Oxy IR/ROXICODONE Take 1 tablet (5 mg total) by mouth every 6 (six) hours as needed for up to 5 days for severe pain.   prenatal vitamin w/FE, FA 27-1 MG Tabs tablet Take 1 tablet by mouth daily at 12 noon. What changed:  when to take this   senna-docusate 8.6-50 MG tablet Commonly known as:  Senokot-S Take 2 tablets by mouth at bedtime as needed for mild constipation.      Postpartum contraception: Progesterone only pills Diet: Routine Diet Activity: Advance as  tolerated. Pelvic rest for 6 weeks.   Follow-up Appt: Future Appointments  Date Time Provider Department Center  06/05/2018 12:00 PM Lazaro Arms, MD [facility]   Please schedule this patient for Postpartum visit in: 4 weeks with the following provider: Any provider For C/S patients schedule nurse incision check in weeks 2 weeks: yes Low risk pregnancy complicated by: LGA Delivery mode:  CS Anticipated Birth Control:  POPs PP Procedures needed: Incision check  Schedule Integrated BH visit: no  Newborn Data: Live born female  Birth Weight:   APGAR: 8, 9  Newborn Delivery   Birth date/time:  05/30/2018 07:48:00 Delivery type:      Baby Feeding: Breast Disposition:home with mother  Cristal Deer. Earlene Plater, DO OB/GYN Fellow, Faculty Practice

## 2018-05-30 NOTE — Transfer of Care (Signed)
Immediate Anesthesia Transfer of Care Note  Patient: Margaret Pierce  Procedure(s) Performed: CESAREAN SECTION (N/A )  Patient Location: PACU  Anesthesia Type:Spinal  Level of Consciousness: awake, alert  and oriented  Airway & Oxygen Therapy: Patient Spontanous Breathing  Post-op Assessment: Report given to RN and Post -op Vital signs reviewed and stable  Post vital signs: Reviewed and stable  Last Vitals:  Vitals Value Taken Time  BP 102/78 05/30/2018  8:38 AM  Temp    Pulse 80 05/30/2018  8:45 AM  Resp 28 05/30/2018  8:45 AM  SpO2 97 % 05/30/2018  8:45 AM  Vitals shown include unvalidated device data.  Last Pain:  Vitals:   05/30/18 0552  TempSrc: Oral  PainSc: 0-No pain         Complications: No apparent anesthesia complications

## 2018-05-30 NOTE — Op Note (Signed)
Attestation of Attending Supervision of Advanced Practitioner (CNM/NP/PA): Evaluation and management procedures were performed by the Advanced Practitioner under my supervision and collaboration. I have reviewed the Advanced Practitioner's note and chart, and I agree with the management and plan.  Rockne Coons MD Attending Physician for the [facility] 05/30/2018 11:23 AM     Cesarean Section Procedure Note   Margaret Pierce 05/30/2018  Pre-operative Diagnosis: fetal malpresentation, suspected fetal macrosomia.  Post-operative Diagnosis: Same   Surgeon: Surgeon(s) and Role:    * Lazaro Arms, MD - Primary   Assistants: Rhett Bannister, DO  Anesthesia: spinal  Estimated Blood Loss: 325 mL  Total IV Fluids:  Urine Output: 200cc of clear urine Specimens: None  Findings: Normal appearing uterus  Baby condition / location:  Couplet care / Skin to Skin APGAR: 8, 9 ; weight  .    Complications: no complications  Indications: Margaret Pierce is a 28 y.o. G1P0 with an IUP [redacted]w[redacted]d presenting for primary LTCS for presumed fetal macrosomia.  The risks, benefits, complications, treatment options, and expected outcomes were discussed with the patient . The patient concurred with the proposed plan, giving informed consent. identified as Margaret Pierce and the procedure verified as C-Section Delivery.  Procedure Details: A Time Out was held and the above information confirmed.  The patient was taken back to the operative suite where spinal anesthesia was placed. After induction of anesthesia, the patient was draped and prepped in the usual sterile manner and placed in a dorsal supine position with a leftward tilt. A transverse was made and carried down through the subcutaneous tissue to the fascia. Fascial incision was made and extended transversely. The fascia was separated from the underlying rectus tissue superiorly and inferiorly. The peritoneum was identified and  entered. Peritoneal incision was extended longitudinally. The utero-vesical peritoneal reflection was identified, and a bladder blade was placed. A low transverse uterine incision was made. Delivered from cephalic presentation was a pending gram female newborn infant  with Apgar scores of 8 at one minute and 9 at five minutes. Cord ph was sent the umbilical cord was clamped and cut cord blood was obtained for evaluation. The placenta was removed Intact and appeared normal. The uterine outline, tubes and ovaries appeared normal}. The uterine incision was closed with running locked sutures of 0 Monocryl.  A second imbricating layer was placed and hemostasis was observed. Lavage was carried out until clear. The abdominal wall muscles and peritoneum were closed with two simple interrupted sutures with chromic. The fascia was then reapproximated with running sutures of 0Vicryl. The skin was closed with 4-0Vicryl.   Instrument, sponge, and needle counts were correct prior the abdominal closure and were correct at the conclusion of the case.   Disposition: PACU - hemodynamically stable.  Maternal Condition: stable       SignedCristal Deer [facility] 05/30/2018 9:12 AM

## 2018-05-30 NOTE — H&P (Signed)
Margaret Pierce is a 28 y.o. female G1P0 Estimated Date of Delivery: 05/31/18 [redacted]w[redacted]d  presenting for primary Caesarean section for fetal malpresentation oblique with fetal vertex to the maternal right, fetal vertex is out of the pelvis, and there is suspected fetal macrosomia with EFW 4500 grams.Discussed at length with patient and FOB regarding IOL at 41 weeks vs spontaneous labor. Concern of SD especially with vertex not in pelvis.  Decided to proceed with primary Caesarean section OB History    Gravida  1   Para      Term      Preterm      AB      Living        SAB      TAB      Ectopic      Multiple      Live Births             Past Medical History:  Diagnosis Date  . ADD (attention deficit disorder)   . Contraceptive management 08/19/2013  . Dysmenorrhea 06/26/2012  . Hematuria 03/22/2014  . IBS (irritable bowel syndrome) 11/25/2014  . Menorrhagia 06/26/2012  . RLQ abdominal pain 03/22/2014  . Urinary frequency 03/22/2014  . Weight gain 08/19/2013   Past Surgical History:  Procedure Laterality Date  . KNEE ARTHROSCOPY Bilateral 2007, 2009  . WISDOM TOOTH EXTRACTION     Family History: family history includes Alcohol abuse in her father; Cancer in her maternal grandfather; Cirrhosis in her father; Diabetes in her father; Hypertension in her mother; Other in her paternal grandfather; Thyroid disease in her maternal grandmother. Social History:  reports that she quit smoking about 12 years ago. Her smoking use included cigarettes. She has a 0.25 pack-year smoking history. She has never used smokeless tobacco. She reports previous alcohol use. She reports that she does not use drugs.     Maternal Diabetes: No Genetic Screening: Normal Maternal Ultrasounds/Referrals: Abnormal:  Findings:   Other:isolated LV EICF Fetal Ultrasounds or other Referrals:  None Maternal Substance Abuse:  No Significant Maternal Medications:  None Significant Maternal Lab Results:   None Other Comments:  Suspected fetal macrosomia normal GTT  ROS   Review of Systems  Constitutional: Negative for fever, chills, weight loss, malaise/fatigue and diaphoresis.  HENT: Negative for hearing loss, ear pain, nosebleeds, congestion, sore throat, neck pain, tinnitus and ear discharge.   Eyes: Negative for blurred vision, double vision, photophobia, pain, discharge and redness.  Respiratory: Negative for cough, hemoptysis, sputum production, shortness of breath, wheezing and stridor.   Cardiovascular: Negative for chest pain, palpitations, orthopnea, claudication, leg swelling and PND.  Gastrointestinal: negative for abdominal pain. Negative for heartburn, nausea, vomiting, diarrhea, constipation, blood in stool and melena.  Genitourinary: Negative for dysuria, urgency, frequency, hematuria and flank pain.  Musculoskeletal: Negative for myalgias, back pain, joint pain and falls.  Skin: Negative for itching and rash.  Neurological: Negative for dizziness, tingling, tremors, sensory change, speech change, focal weakness, seizures, loss of consciousness, weakness and headaches.  Endo/Heme/Allergies: Negative for environmental allergies and polydipsia. Does not bruise/bleed easily.  Psychiatric/Behavioral: Negative for depression, suicidal ideas, hallucinations, memory loss and substance abuse. The patient is not nervous/anxious and does not have insomnia.      History  Past Medical History:  Diagnosis Date  . ADD (attention deficit disorder)   . Contraceptive management 08/19/2013  . Dysmenorrhea 06/26/2012  . Hematuria 03/22/2014  . IBS (irritable bowel syndrome) 11/25/2014  . Menorrhagia 06/26/2012  . RLQ abdominal pain  03/22/2014  . Urinary frequency 03/22/2014  . Weight gain 08/19/2013    Past Surgical History:  Procedure Laterality Date  . KNEE ARTHROSCOPY Bilateral 2007, 2009  . WISDOM TOOTH EXTRACTION      OB History    Gravida  1   Para      Term      Preterm       AB      Living        SAB      TAB      Ectopic      Multiple      Live Births              Allergies  Allergen Reactions  . Cefpodoxime Hives and Rash    Vantin as a child Has taken and tolerated Amoxil since then  . Codeine Nausea And Vomiting    Social History   Socioeconomic History  . Marital status: Single    Spouse name: Not on file  . Number of children: Not on file  . Years of education: Not on file  . Highest education level: Not on file  Occupational History  . Not on file  Social Needs  . Financial resource strain: Not hard at all  . Food insecurity:    Worry: Never true    Inability: Never true  . Transportation needs:    Medical: No    Non-medical: Not on file  Tobacco Use  . Smoking status: Former Smoker    Packs/day: 0.50    Years: 0.50    Pack years: 0.25    Types: Cigarettes    Last attempt to quit: 05/29/2006    Years since quitting: 12.0  . Smokeless tobacco: Never Used  Substance and Sexual Activity  . Alcohol use: Not Currently    Comment: occ  . Drug use: No  . Sexual activity: Yes    Birth control/protection: None  Lifestyle  . Physical activity:    Days per week: Not on file    Minutes per session: Not on file  . Stress: Only a little  Relationships  . Social connections:    Talks on phone: Not on file    Gets together: Not on file    Attends religious service: Not on file    Active member of club or organization: Not on file    Attends meetings of clubs or organizations: Not on file    Relationship status: Not on file  Other Topics Concern  . Not on file  Social History Narrative  . Not on file    Family History  Problem Relation Age of Onset  . Hypertension Mother   . Thyroid disease Maternal Grandmother   . Alcohol abuse Father   . Diabetes Father   . Cirrhosis Father   . Other Paternal Grandfather        cirrhosis  . Cancer Maternal Grandfather        colon      Blood pressure 138/81,  pulse 94, temperature 97.8 F (36.6 C), temperature source Oral, height  (1.626 m), weight (!) 142.9 kg, last menstrual period 08/12/2017, SpO2 100 %. Exam Physical Exam  Physical Exam  Vitals reviewed. Constitutional: She is oriented to person, place, and time. She appears well-developed and well-nourished.  HENT:  Head: Normocephalic and atraumatic.  Right Ear: External ear normal.  Left Ear: External ear normal.  Nose: Nose normal.  Mouth/Throat: Oropharynx is clear and moist.  Eyes: Conjunctivae and  EOM are normal. Pupils are equal, round, and reactive to light. Right eye exhibits no discharge. Left eye exhibits no discharge. No scleral icterus.  Neck: Normal range of motion. Neck supple. No tracheal deviation present. No thyromegaly present.  Cardiovascular: Normal rate, regular rhythm, normal heart sounds and intact distal pulses.  Exam reveals no gallop and no friction rub.   No murmur heard. Respiratory: Effort normal and breath sounds normal. No respiratory distress. She has no wheezes. She has no rales. She exhibits no tenderness.  GI: Soft. Bowel sounds are normal. She exhibits no distension and no mass. There is tenderness. There is no rebound and no guarding.  Genitourinary:       Vulva is normal without lesions Vagina is pink moist without discharge Cervix normal in appearance and pap is normal Uterus is FH 47 cm Adnexa is negative with normal sized ovaries by sonogram  Musculoskeletal: Normal range of motion. She exhibits no edema and no tenderness.  Neurological: She is alert and oriented to person, place, and time. She has normal reflexes. She displays normal reflexes. No cranial nerve deficit. She exhibits normal muscle tone. Coordination normal.  Skin: Skin is warm and dry. No rash noted. No erythema. No pallor.  Psychiatric: She has a normal mood and affect. Her behavior is normal. Judgment and thought content normal.    Prenatal labs: ABO, Rh: A/Positive/--  (08/02 1414) Antibody: Negative (12/06 0853) Rubella: 3.60 (08/02 1414) RPR: Non Reactive (12/06 0853)  HBsAg: Negative (08/02 1414)  HIV: Non Reactive (12/06 0853)  GBS: Positive (02/11 1200)   Assessment/Plan: [redacted]w[redacted]d Estimated Date of Delivery: 05/31/18  Fetal oblique lie with fetal vertex to the maternal right Fetal vertex out of the pelvis with suspected fetal macrosomia, EFW 4500 grams with normal GTT  Proceed with primary Caesarean section  Pt understands the risks of surgery including but not limited t  excessive bleeding requiring transfusion or reoperation, post-operative infection requiring prolonged hospitalization or re-hospitalization and antibiotic therapy, and damage to other organs including bladder, bowel, ureters and major vessels.  The patient also understands the alternative treatment options which were discussed in full.  All questions were answered.  Lazaro Arms 05/30/2018 6:52 AM    Lazaro Arms 05/30/2018, 6:47 AM

## 2018-05-30 NOTE — Anesthesia Procedure Notes (Signed)
Spinal  Patient location during procedure: OB Start time: 05/30/2018 7:23 AM End time: 05/30/2018 7:28 AM Staffing Anesthesiologist: Trevor Iha, MD Performed: anesthesiologist  Preanesthetic Checklist Completed: patient identified, surgical consent, pre-op evaluation, timeout performed, IV checked, risks and benefits discussed and monitors and equipment checked Spinal Block Patient position: sitting Prep: site prepped and draped and DuraPrep Patient monitoring: heart rate, cardiac monitor, continuous pulse ox and blood pressure Approach: midline Location: L3-4 Injection technique: single-shot Needle Needle type: Whitacre  Needle gauge: 27 G Needle length: 10 cm Needle insertion depth: 8 cm Assessment Sensory level: T4 Additional Notes 1 Attempt (s). Pt tolerated procedure well.

## 2018-05-30 NOTE — Anesthesia Postprocedure Evaluation (Signed)
Anesthesia Post Note  Patient: Margaret Pierce  Procedure(s) Performed: CESAREAN SECTION (N/A )     Patient location during evaluation: Mother Baby Anesthesia Type: Spinal Level of consciousness: oriented and awake and alert Pain management: pain level controlled Vital Signs Assessment: post-procedure vital signs reviewed and stable Respiratory status: spontaneous breathing and respiratory function stable Cardiovascular status: blood pressure returned to baseline and stable Postop Assessment: no headache, no backache, no apparent nausea or vomiting and able to ambulate Anesthetic complications: no    Last Vitals:  Vitals:   05/30/18 1004 05/30/18 1114  BP: (!) 123/56 110/61  Pulse: 75 75  Resp: 18 17  Temp: 36.5 C 36.6 C  SpO2: 98%     Last Pain:  Vitals:   05/30/18 1114  TempSrc: Oral  PainSc:    Pain Goal:                   Trevor Iha

## 2018-05-31 LAB — CBC
HCT: 34.6 % — ABNORMAL LOW (ref 36.0–46.0)
Hemoglobin: 11.2 g/dL — ABNORMAL LOW (ref 12.0–15.0)
MCH: 30.4 pg (ref 26.0–34.0)
MCHC: 32.4 g/dL (ref 30.0–36.0)
MCV: 93.8 fL (ref 80.0–100.0)
Platelets: 148 10*3/uL — ABNORMAL LOW (ref 150–400)
RBC: 3.69 MIL/uL — ABNORMAL LOW (ref 3.87–5.11)
RDW: 13.9 % (ref 11.5–15.5)
WBC: 12.7 10*3/uL — AB (ref 4.0–10.5)
nRBC: 0 % (ref 0.0–0.2)

## 2018-05-31 NOTE — Anesthesia Postprocedure Evaluation (Signed)
Anesthesia Post Note  Patient: Margaret Pierce  Procedure(s) Performed: CESAREAN SECTION (N/A )     Patient location during evaluation: Mother Baby Anesthesia Type: Spinal Level of consciousness: awake and alert Pain management: pain level controlled Vital Signs Assessment: post-procedure vital signs reviewed and stable Respiratory status: spontaneous breathing, nonlabored ventilation and respiratory function stable Cardiovascular status: stable Postop Assessment: no headache, no backache, no apparent nausea or vomiting, patient able to bend at knees, able to ambulate, adequate PO intake and spinal receding Anesthetic complications: no    Last Vitals:  Vitals:   05/31/18 0630 05/31/18 1354  BP: 107/62 129/72  Pulse: 79 71  Resp: 16 18  Temp: 36.5 C 36.7 C  SpO2: 98% 100%    Last Pain:  Vitals:   05/31/18 1730  TempSrc:   PainSc: 2    Pain Goal: Patients Stated Pain Goal: 3 (05/31/18 1045)                 Devra Stare Hristova

## 2018-05-31 NOTE — Addendum Note (Signed)
Addendum  created 05/31/18 1822 by Elgie Congo, CRNA   Clinical Note Signed

## 2018-05-31 NOTE — Lactation Note (Signed)
This note was copied from a baby's chart. Lactation Consultation Note  Patient Name: Margaret Pierce [id number] Date: 05/31/2018 Reason for consult: Follow-up assessment;Mother's request;1st time breastfeeding;Primapara;Term  F/U visit after multiple attempts to assist. Baby is now 37 hours old with 6% wt loss, P1 mom. Mom states baby just fed for 20 min prior to [facility] entering. Mom denies any pain or discomfort with latch except for "burning" near the end of the feeding. Encouraged mom to use EBM on nipples after feedings. Mom states she is comfortable with football hold on left breast as she is left-handed and cross cradle position on right.  Reviewed hand expression, breast massage and breast compression. Encouraged mom to feed baby 8-12 times in 24 hours and with feeding cues, waking infant to feed if necessary. Discussed ways to wake a sleepy baby including removing clothing/STS, changing diaper, giving colostrum via finger after hand expression. Reminded mom to alternate breasts with each feeding and allow baby to decided when feeding is over.  Encouraged mom to call out with any concerns.  Maternal Data Has patient been taught Hand Expression?: Yes(reinforced) Does the patient have breastfeeding experience prior to this delivery?: No  Feeding Feeding Type: Breast Fed   Interventions Interventions: Breast feeding basics reviewed;Skin to skin;Breast massage;Hand express;Breast compression;Adjust position;Position options;Expressed milk  Consult Status Consult Status: Follow-up Date: 06/01/18 Follow-up type: In-patient    Virgia Land 05/31/2018, 8:01 PM

## 2018-05-31 NOTE — Progress Notes (Signed)
Postop Day #1  Subjective: Doing well aside from being tired and having pain, especially with moving in bed. Worried about pain medications because of history of N/V with codeine after a knee surgery in the past.   Objective: Blood pressure 107/62, pulse 79, temperature 97.7 F (36.5 C), temperature source Oral, resp. rate 16, height 5\' 4"  (1.626 m), weight (!) 142.9 kg, last menstrual period 08/12/2017, SpO2 98 %, unknown if currently breastfeeding.  Physical Exam:  General: alert, well-appearing, NAD Lochia: appropriate Uterine Fundus: firm Incision: honeycomb dressing C/D/I DVT Evaluation: trace non-pitting edema  Recent Labs    05/30/18 1147 05/31/18 0427  HGB 12.1 11.2*  HCT 36.4 34.6*    Assessment/Plan: Routine post-op, postpartum care HgB stable Advised to trial oxycodone given potential to not have an allergy this time Continue ibuprofen and Tylenol for pain  Likely discharge home tomorrow    LOS: 1 day   Margaret Pierce Ranell Skibinski 05/31/2018, 8:32 AM

## 2018-05-31 NOTE — Lactation Note (Signed)
This note was copied from a baby's chart. Lactation Consultation Note  Patient Name: Margaret Pierce [id number] Date: 05/31/2018    [facility] entered room to assist with feeding as last feed was @ 1450. Multiple visitors in room and holding baby who is sleeping soundly. Parents asked for LC to return later. Encouraged parents not to let baby go over 4 hours without trying to feed. Discussed ways to wake baby by checking diaper, removing clothing, etc.  Virgia Land 05/31/2018, 6:47 PM

## 2018-05-31 NOTE — Lactation Note (Signed)
This note was copied from a baby's chart. Lactation Consultation Note  Patient Name: Margaret Pierce [id number] Date: 05/31/2018 Reason for consult: Initial assessment;1st time breastfeeding;Term P1, 16 hour female infant, LGA , C/C delivery. Per parents infant been sleepy not latching well. Per parents, infant had 2 voids and 3 stools since delivery. Infant reluctant latch at first after few attempts. Infant took 4 ml of expressed breast milk by spoon then started cuing to breastfeed.  Mom latched infant on left breast using the cross cradle hold infant, latched and sustained latched for 6 minutes. LC discussed with mom, infant's nose should touch breast, mouth should be wide with tongue and lower jaw extended downward. Per mom, she feels a tug but no pain, few swallows could be heard, Dad felt that is best latch infant had since birth. Mom gave additional 4 ml after breastfeeding infant for 6 minutes. Mom will continue to work on latching infant to breast. Mom knows to breastfeed infant according hunger cues, 8 or more times within 24 hours.  LC discussed I & O. Reviewed Baby & Me book's Breastfeeding Basics.  Mom will call Nurse or LC if she has any more questions, concerns or needs assistance with latching infant to breast. Mom made aware of O/P services, breastfeeding support groups, community resources, and our phone # for post-discharge questions.   Maternal Data Formula Feeding for Exclusion: Yes Reason for exclusion: Mother's choice to formula and breast feed on admission Has patient been taught Hand Expression?: Yes(mom hand  expressed 8 ml of colostrum that was spoon feed to infant  ) Does the patient have breastfeeding experience prior to this delivery?: No  Feeding Feeding Type: Breast Milk  LATCH Score Latch: Repeated attempts needed to sustain latch, nipple held in mouth throughout feeding, stimulation needed to elicit sucking reflex.  Audible Swallowing: A few with  stimulation  Type of Nipple: Everted at rest and after stimulation  Comfort (Breast/Nipple): Soft / non-tender  Hold (Positioning): Assistance needed to correctly position infant at breast and maintain latch.  LATCH Score: 7  Interventions Interventions: Breast feeding basics reviewed;Assisted with latch;Skin to skin;Breast massage;Hand express;Breast compression;Adjust position;Support pillows;Expressed milk;Position options  Lactation Tools Discussed/Used WIC Program: No   Consult Status Consult Status: Follow-up Date: 05/31/18 Follow-up type: In-patient    Danelle Earthly 05/31/2018, 12:09 AM

## 2018-05-31 NOTE — Lactation Note (Signed)
This note was copied from a baby's chart. Lactation Consultation Note  Patient Name: Margaret Pierce Date: 05/31/2018   Entered mom's room to follow-up, baby is 5 hours old with 6% wt loss. Mom states baby last fed @ 1450 for 30 minutes and she felt good about the feeding. Mom states she is very nauseous right now and asked if LC could return later. Mom states baby should be having his 24 hr testing soon and she will attempt to feed after that. Informed that LC will check baby soon but to call out if necessary.   Virgia Land 05/31/2018, 4:46 PM

## 2018-06-01 MED ORDER — IBUPROFEN 800 MG PO TABS: 800.0000 mg | ORAL_TABLET | Freq: Three times a day (TID) | ORAL | 0 refills | Status: DC

## 2018-06-01 MED ORDER — SENNOSIDES-DOCUSATE SODIUM 8.6-50 MG PO TABS: 2.0000 | ORAL_TABLET | Freq: Every evening | ORAL | 0 refills | Status: DC | PRN

## 2018-06-01 MED ORDER — OXYCODONE HCL 5 MG PO TABS: 5.0000 mg | ORAL_TABLET | Freq: Four times a day (QID) | ORAL | 0 refills | Status: AC | PRN

## 2018-06-01 MED ORDER — FUROSEMIDE 20 MG PO TABS: 20.0000 mg | ORAL_TABLET | Freq: Every day | ORAL | 0 refills | Status: DC

## 2018-06-01 NOTE — Progress Notes (Signed)
Attending Circumcision Counseling Progress Note  Patient desires circumcision for her female infant.  Circumcision procedure details discussed, risks and benefits of procedure were also discussed.  These include but are not limited to: Benefits of circumcision in men include reduction in the rates of urinary tract infection (UTI), penile cancer, some sexually transmitted infections, penile inflammatory and retractile disorders, as well as easier hygiene.  Risks include bleeding , infection, injury of glans which may lead to penile deformity or urinary tract issues, unsatisfactory cosmetic appearance and other potential complications related to the procedure.  It was emphasized that this is an elective procedure.  Patient wants to proceed with circumcision; written informed consent obtained.  Will do circumcision soon, routine circumcision and post circumcision care ordered for the infant.  Margaret Collins, MD, FACOG Obstetrician & Gynecologist, [facility] for [employer], [facility]

## 2018-06-01 NOTE — Lactation Note (Signed)
This note was copied from a baby's chart. Lactation Consultation Note  Patient Name: Margaret Pierce [id number] Date: 06/01/2018 Reason for consult: Mother's request  Mom is comfortable with latch. "[name]'s" latch looks good. Swallows verified by cervical auscultation (Dad listened, too!). Swallows increased with breast compression. Purpose of adding breast compressions discussed. Signs of satiety discussed.   Parents have our phone # to call if they have any concerns post-discharge.    [name] [facility] 06/01/2018, 11:25 AM

## 2018-06-01 NOTE — Lactation Note (Addendum)
This note was copied from a baby's chart. Lactation Consultation Note  Patient Name: Margaret Pierce [id number] Date: 06/01/2018 Reason for consult: Follow-up assessment  Parents were shown signs/sound of swallowing & both parents affirm that infant is swallowing frequently with feedings. Parents would like me to return to observe a feeding to confirm.  Mom reports + breast changes w/pregnancy. Mom reports nipple shape is rounded when infant releases latch. Mom does have a very small blood blister on R nipple, but otherwise her nipple is atraumatic.   Parents to call me when infant ready to feed again. Mom to take Lasix 20mg  qd X 3 days (L3).   [name] [facility] 06/01/2018, 10:26 AM

## 2018-06-03 ENCOUNTER — Telehealth (HOSPITAL_COMMUNITY): Payer: Self-pay

## 2018-06-05 ENCOUNTER — Encounter: Payer: Self-pay | Admitting: Obstetrics & Gynecology

## 2018-06-05 ENCOUNTER — Other Ambulatory Visit: Payer: Self-pay

## 2018-06-05 ENCOUNTER — Ambulatory Visit (INDEPENDENT_AMBULATORY_CARE_PROVIDER_SITE_OTHER): Payer: Managed Care, Other (non HMO) | Admitting: Obstetrics & Gynecology

## 2018-06-05 VITALS — BP 117/70 | HR 83 | Ht 64.0 in | Wt 286.0 lb

## 2018-06-05 DIAGNOSIS — B372 Candidiasis of skin and nail: Secondary | ICD-10-CM

## 2018-06-05 DIAGNOSIS — Z98891 History of uterine scar from previous surgery: Secondary | ICD-10-CM

## 2018-06-05 NOTE — Progress Notes (Addendum)
  HPI: Patient returns for routine postoperative follow-up having undergone primary C section 05/30/2018 on .  The patient's immediate postoperative recovery has been unremarkable. Since hospital discharge the patient reports no problems.   Current Outpatient Medications: acetaminophen (TYLENOL) 500 MG tablet, Take 500-1,000 mg by mouth every 6 (six) hours as needed (for pain.). , Disp: , Rfl:  cetirizine (ZYRTEC) 10 MG tablet, Take 10 mg by mouth daily., Disp: , Rfl:  furosemide (LASIX) 20 MG tablet, Take 1 tablet (20 mg total) by mouth daily for 3 days. For swelling, Disp: 3 tablet, Rfl: 0 ibuprofen (ADVIL,MOTRIN) 800 MG tablet, Take 1 tablet (800 mg total) by mouth every 8 (eight) hours., Disp: 30 tablet, Rfl: 0 omeprazole (PRILOSEC) 20 MG capsule, Take 1 capsule (20 mg total) by mouth daily., Disp: 90 capsule, Rfl: 3 oxyCODONE (OXY IR/ROXICODONE) 5 MG immediate release tablet, Take 1 tablet (5 mg total) by mouth every 6 (six) hours as needed for up to 5 days for severe pain., Disp: 20 tablet, Rfl: 0 prenatal vitamin w/FE, FA (PRENATAL 1 + 1) 27-1 MG TABS tablet, Take 1 tablet by mouth daily at 12 noon. (Patient taking differently: Take 1 tablet by mouth daily. ), Disp: 30 each, Rfl: 12 senna-docusate (SENOKOT-S) 8.6-50 MG tablet, Take 2 tablets by mouth at bedtime as needed for mild constipation., Disp: 10 tablet, Rfl: 0  No current facility-administered medications for this visit.     unknown if currently breastfeeding.  Physical Exam: Some yeast changes are present in the right lateral portion of the incision Honeycomb dressing is removed GV applied  Diagnostic Tests:   Pathology:   Impression: S/p primary Caesraen section Post op cutaneous yeast, GV applied  Plan: Return in about 1 week (around 06/12/2018) for Post Op, with Dr Despina Hidden possible gentian violet.   Follow up: 1  weeks  Lazaro Arms, MD

## 2018-06-12 ENCOUNTER — Ambulatory Visit (INDEPENDENT_AMBULATORY_CARE_PROVIDER_SITE_OTHER): Payer: Managed Care, Other (non HMO) | Admitting: Obstetrics & Gynecology

## 2018-06-12 ENCOUNTER — Encounter: Payer: Self-pay | Admitting: Obstetrics & Gynecology

## 2018-06-12 ENCOUNTER — Other Ambulatory Visit: Payer: Self-pay

## 2018-06-12 VITALS — BP 109/62 | HR 54 | Ht 64.0 in | Wt 273.0 lb

## 2018-06-12 DIAGNOSIS — B372 Candidiasis of skin and nail: Secondary | ICD-10-CM

## 2018-06-12 DIAGNOSIS — Z98891 History of uterine scar from previous surgery: Secondary | ICD-10-CM

## 2018-06-12 NOTE — Progress Notes (Signed)
  HPI: Patient returns for routine postoperative follow-up having undergone primary C section  on 05/30/2018.  The patient's immediate postoperative recovery has been unremarkable. Since hospital discharge the patient reports no problems.   Current Outpatient Medications: acetaminophen (TYLENOL) 500 MG tablet, Take 500-1,000 mg by mouth every 6 (six) hours as needed (for pain.). , Disp: , Rfl:  cetirizine (ZYRTEC) 10 MG tablet, Take 10 mg by mouth daily., Disp: , Rfl:  ibuprofen (ADVIL,MOTRIN) 800 MG tablet, Take 1 tablet (800 mg total) by mouth every 8 (eight) hours., Disp: 30 tablet, Rfl: 0 omeprazole (PRILOSEC) 20 MG capsule, Take 1 capsule (20 mg total) by mouth daily., Disp: 90 capsule, Rfl: 3 prenatal vitamin w/FE, FA (PRENATAL 1 + 1) 27-1 MG TABS tablet, Take 1 tablet by mouth daily at 12 noon. (Patient taking differently: Take 1 tablet by mouth daily. ), Disp: 30 each, Rfl: 12 furosemide (LASIX) 20 MG tablet, Take 1 tablet (20 mg total) by mouth daily for 3 days. For swelling, Disp: 3 tablet, Rfl: 0  No current facility-administered medications for this visit.     Blood pressure 109/62, pulse (!) 54, height 5\' 4"  (1.626 m), weight 273 lb (123.8 kg), currently breastfeeding.  Physical Exam: Incision clean dry intact, yeast changes are dramatically improved  Diagnostic Tests:   Pathology:   Impression: S/p primary Caesarean section  Plan:   Follow up: 4  weeks  Lazaro Arms, MD

## 2018-07-09 ENCOUNTER — Telehealth: Payer: Self-pay | Admitting: *Deleted

## 2018-07-09 NOTE — Telephone Encounter (Signed)
Pt instructed on how to do webex visit tomorrow.

## 2018-07-10 ENCOUNTER — Ambulatory Visit (INDEPENDENT_AMBULATORY_CARE_PROVIDER_SITE_OTHER): Payer: Federal, State, Local not specified - PPO | Admitting: Advanced Practice Midwife

## 2018-07-10 ENCOUNTER — Encounter: Payer: Self-pay | Admitting: Advanced Practice Midwife

## 2018-07-10 ENCOUNTER — Other Ambulatory Visit: Payer: Self-pay

## 2018-07-10 MED ORDER — OMEPRAZOLE 20 MG PO CPDR: 20.0000 mg | DELAYED_RELEASE_CAPSULE | Freq: Every day | ORAL | 3 refills | Status: DC

## 2018-07-10 MED ORDER — NORETHINDRONE 0.35 MG PO TABS: ORAL_TABLET | ORAL | 11 refills | Status: DC

## 2018-07-10 NOTE — Progress Notes (Signed)
TELEHEALTH VIRTUAL POSTPARTUM VISIT ENCOUNTER NOTE  I connected with@ on 07/10/18 at 11:45 AM EDT by telephone at home and verified that I am speaking with the correct person using two identifiers.   I discussed the limitations, risks, security and privacy concerns of performing an evaluation and management service by telephone and the availability of in person appointments. I also discussed with the patient that there may be a patient responsible charge related to this service. The patient expressed understanding and agreed to proceed.  Appointment Date: 07/10/2018  [facility] Clinic: [facility]  Chief Complaint:  Chief Complaint  Patient presents with  . Postpartum Care    History of Present Illness: Margaret Pierce is a 28 y.o. Caucasian G1P1001 (No LMP recorded.), seen for the above chief complaint. Her past medical history is significant for IBS   She is s/p primary cesarean section on 2/28 at 39.6 weeks, PLTCS for macrosomia; she was discharged to home on 3/1. Pregnancy complicated by suspected macrosomia. Baby is doing well.  Complains of nothing  Vaginal bleeding or discharge: No  Mode of feeding infant: Breast Intercourse: No  Contraception: oral progesterone-only contraceptive PP depression s/s: No .  Any bowel or bladder issues: No  Pap smear: 12/30/15, normal   Review of Systems: Positive for nothing. Her 12 point review of systems is negative or as noted in the History of Present Illness.  Patient Active Problem List   Diagnosis Date Noted  . Cesarean delivery delivered 05/30/2018  . LGA (large for gestational age) fetus affecting management of mother 05/30/2018  . Positive GBS test 05/30/2018  . Supervision of normal first pregnancy 11/01/2017  . IBS (irritable bowel syndrome) 11/25/2014  . ADD (attention deficit disorder) 06/26/2012    Medications Margaret Pierce had no medications administered during this visit. Current Outpatient Medications  Medication Sig  Dispense Refill  . cetirizine (ZYRTEC) 10 MG tablet Take 10 mg by mouth daily.    Marland Kitchen. omeprazole (PRILOSEC) 20 MG capsule Take 1 capsule (20 mg total) by mouth daily. 90 capsule 3  . prenatal vitamin w/FE, FA (PRENATAL 1 + 1) 27-1 MG TABS tablet Take 1 tablet by mouth daily at 12 noon. (Patient taking differently: Take 1 tablet by mouth daily. ) 30 each 12  . acetaminophen (TYLENOL) 500 MG tablet Take 500-1,000 mg by mouth every 6 (six) hours as needed (for pain.).     Marland Kitchen. furosemide (LASIX) 20 MG tablet Take 1 tablet (20 mg total) by mouth daily for 3 days. For swelling 3 tablet 0  . ibuprofen (ADVIL,MOTRIN) 800 MG tablet Take 1 tablet (800 mg total) by mouth every 8 (eight) hours. 30 tablet 0  . norethindrone (MICRONOR,CAMILA,ERRIN) 0.35 MG tablet Take one daily 1 Package 11   No current facility-administered medications for this visit.     Allergies Cefpodoxime and Codeine  Physical Exam:  General:  Alert, oriented and cooperative.   Mental Status: Normal mood and affect perceived. Normal judgment and thought content.  Rest of physical exam deferred due to type of encounter Viewed incision on webex, looks great, yeast rash gone PP Depression Screening:   Edinburgh Postnatal Depression Scale - 07/10/18 1144      Edinburgh Postnatal Depression Scale:  In the Past 7 Days   I have been able to laugh and see the funny side of things.  0    I have looked forward with enjoyment to things.  0    I have blamed myself unnecessarily when  things went wrong.  0    I have been anxious or worried for no good reason.  0    I have felt scared or panicky for no good reason.  0    Things have been getting on top of me.  0    I have been so unhappy that I have had difficulty sleeping.  0    I have felt sad or miserable.  0    I have been so unhappy that I have been crying.  0    The thought of harming myself has occurred to me.  0    Edinburgh Postnatal Depression Scale Total  0        Assessment:Patient is a 28 y.o. G1P1001 who is 5 weeks postpartum from a primary cesarean section.  She is doing very well.   Plan: Start POPs today, BU for 2 weeks There are no diagnoses linked to this encounter.  RTC 10/20 for pap  I discussed the assessment and treatment plan with the patient. The patient was provided an opportunity to ask questions and all were answered. The patient agreed with the plan and demonstrated an understanding of the instructions.   The patient was advised to call back or seek an in-person evaluation/go to the ED for any concerning postpartum symptoms.  I provided 14 minutes of non-face-to-face time during this encounter.   Jacklyn Shell, CNM [facility] [employer]

## 2018-08-19 ENCOUNTER — Other Ambulatory Visit: Payer: Self-pay | Admitting: Women's Health

## 2018-08-19 ENCOUNTER — Telehealth: Payer: Self-pay | Admitting: Obstetrics & Gynecology

## 2018-08-19 ENCOUNTER — Encounter: Payer: Self-pay | Admitting: Advanced Practice Midwife

## 2018-08-19 MED ORDER — NORETHIN ACE-ETH ESTRAD-FE 1-20 MG-MCG(24) PO CHEW: 1.0000 | CHEWABLE_TABLET | Freq: Every day | ORAL | 11 refills | Status: DC

## 2018-08-19 NOTE — Telephone Encounter (Signed)
Patient called, she is requesting a return to work note for 08/26/18, needs it as soon as possible because she has to be cleared through work.  Fax [phone] Attn:  Bjorn Loser  [phone]

## 2018-09-12 ENCOUNTER — Encounter: Payer: Self-pay | Admitting: Family Medicine

## 2019-05-15 ENCOUNTER — Other Ambulatory Visit: Payer: Self-pay | Admitting: Adult Health

## 2019-05-15 MED ORDER — DESOGESTREL-ETHINYL ESTRADIOL 0.15-30 MG-MCG PO TABS: 1.0000 | ORAL_TABLET | Freq: Every day | ORAL | 11 refills | Status: DC

## 2019-05-15 NOTE — Progress Notes (Signed)
rx apri

## 2019-09-22 ENCOUNTER — Telehealth: Payer: Self-pay | Admitting: Family Medicine

## 2019-09-22 ENCOUNTER — Ambulatory Visit: Payer: Federal, State, Local not specified - PPO | Admitting: Orthopedic Surgery

## 2019-09-22 ENCOUNTER — Other Ambulatory Visit: Payer: Self-pay | Admitting: Family Medicine

## 2019-09-22 MED ORDER — AZITHROMYCIN 250 MG PO TABS: ORAL_TABLET | ORAL | 0 refills | Status: DC

## 2019-09-22 NOTE — Telephone Encounter (Signed)
Discussed with pt. Pt verbalized understanding.  °

## 2019-09-22 NOTE — Telephone Encounter (Signed)
Z-Pak was sent to the drugstore in [place] If difficulty breathing high fevers or worse I recommend evaluation in person

## 2019-09-22 NOTE — Telephone Encounter (Signed)
Mom states she is having the same symptoms. Having stuffy nose, sore throat, tightness in chest and unable to cough up anything. Pt states her symptoms started on Sunday. Mom would like something called in if possible. Please advise. Thank you  [facility]  (Please disregard message in son Bentleys chart)

## 2019-10-07 ENCOUNTER — Other Ambulatory Visit: Payer: Self-pay | Admitting: Adult Health

## 2019-10-07 MED ORDER — OMEPRAZOLE 20 MG PO CPDR: 20.0000 mg | DELAYED_RELEASE_CAPSULE | Freq: Every day | ORAL | 3 refills | Status: DC

## 2019-10-07 NOTE — Progress Notes (Signed)
Refill Prilosec.

## 2020-04-16 ENCOUNTER — Other Ambulatory Visit: Payer: Self-pay | Admitting: Adult Health

## 2020-04-28 ENCOUNTER — Encounter: Payer: Self-pay | Admitting: Family Medicine

## 2020-04-28 ENCOUNTER — Ambulatory Visit (INDEPENDENT_AMBULATORY_CARE_PROVIDER_SITE_OTHER): Payer: Managed Care, Other (non HMO) | Admitting: Family Medicine

## 2020-04-28 ENCOUNTER — Other Ambulatory Visit: Payer: Self-pay

## 2020-04-28 DIAGNOSIS — Z0000 Encounter for general adult medical examination without abnormal findings: Secondary | ICD-10-CM | POA: Diagnosis not present

## 2020-04-28 DIAGNOSIS — R5383 Other fatigue: Secondary | ICD-10-CM | POA: Diagnosis not present

## 2020-04-28 DIAGNOSIS — Z13220 Encounter for screening for lipoid disorders: Secondary | ICD-10-CM | POA: Diagnosis not present

## 2020-04-28 NOTE — Progress Notes (Signed)
Patient ID: Margaret Pierce, female    DOB: 12-27-1990, 30 y.o.   MRN: [id number]   Chief Complaint  Patient presents with  . Obesity   Subjective:  CC: weight loss medications (injectable)  This is a chronic problem.  Presents today via telephone visit to discuss weight loss medications.  Has tried multiple diet plans, phentermine and Concerta for her ADD which helped her to control her weight but she is no longer on those medications.  She has tried healthy eating, walks a lot with her job, reports walking 5 to 6 miles a day on the job, and plays in the yard with her son on her days off.  She struggles with her weight.  Current BMI is approximately 55.  She is concerned with developing future health problems, she currently does not have any chronic conditions.  Wants to avoid bariatric surgery due to wanting to have more children in the future.  Is not planning to have a child in the immediate future.  Denies any heart issues, gallbladder issues, and alcoholism.  Reports drinks 1 alcoholic beverage every 1 month to 6 months.  She is due for an annual wellness, her last Pap smear was 2017.   pt would like to discuss injection to help with weight loss. Tried weight watchers, dieting, and phentermine.   Virtual Visit via Telephone Note  I connected with Margaret Pierce on 04/28/20 at  9:00 AM EST by telephone and verified that I am speaking with the correct person using two identifiers.  Location: Patient: home Provider: office   I discussed the limitations, risks, security and privacy concerns of performing an evaluation and management service by telephone and the availability of in person appointments. I also discussed with the patient that there may be a patient responsible charge related to this service. The patient expressed understanding and agreed to proceed.   History of Present Illness:    Observations/Objective:   Assessment and Plan:   Follow Up Instructions:    I  discussed the assessment and treatment plan with the patient. The patient was provided an opportunity to ask questions and all were answered. The patient agreed with the plan and demonstrated an understanding of the instructions.   The patient was advised to call back or seek an in-person evaluation if the symptoms worsen or if the condition fails to improve as anticipated.  I provided 15 minutes of non-face-to-face time during this encounter.     Medical History Margaret Pierce has a past medical history of ADD (attention deficit disorder), Contraceptive management (08/19/2013), Dysmenorrhea (06/26/2012), Hematuria (03/22/2014), IBS (irritable bowel syndrome) (11/25/2014), Menorrhagia (06/26/2012), RLQ abdominal pain (03/22/2014), Urinary frequency (03/22/2014), and Weight gain (08/19/2013).   Outpatient Encounter Medications as of 04/28/2020  Medication Sig  . cetirizine (ZYRTEC) 10 MG tablet Take 10 mg by mouth daily.  . ISIBLOOM 0.15-30 MG-MCG tablet TAKE ONE (1) TABLET EACH DAY  . omeprazole (PRILOSEC) 20 MG capsule Take 1 capsule (20 mg total) by mouth daily.  . [DISCONTINUED] acetaminophen (TYLENOL) 500 MG tablet Take 500-1,000 mg by mouth every 6 (six) hours as needed (for pain.).   . [DISCONTINUED] azithromycin (ZITHROMAX Z-PAK) 250 MG tablet Take 2 tablets (500 mg) on  Day 1,  followed by 1 tablet (250 mg) once daily on Days 2 through 5.  . [DISCONTINUED] furosemide (LASIX) 20 MG tablet Take 1 tablet (20 mg total) by mouth daily for 3 days. For swelling  . [DISCONTINUED] ibuprofen (ADVIL,MOTRIN) 800 MG tablet Take  1 tablet (800 mg total) by mouth every 8 (eight) hours.  . [DISCONTINUED] prenatal vitamin w/FE, FA (PRENATAL 1 + 1) 27-1 MG TABS tablet Take 1 tablet by mouth daily at 12 noon. (Patient taking differently: Take 1 tablet by mouth daily. )   No facility-administered encounter medications on file as of 04/28/2020.     Review of Systems  Constitutional: Negative for chills and fever.   Respiratory: Negative for shortness of breath.   Cardiovascular: Negative for chest pain.  Gastrointestinal: Positive for constipation. Negative for abdominal pain.     Vitals Ht 5\' 4"  (1.626 m) Comment: per pt  Wt (!) 320 lb (145.2 kg) Comment: per pt  BMI 54.93 kg/m  [place] Objective:   Physical Exam No  PE  Assessment and Plan   1. Morbid obesity (HCC) - CBC with Differential - Comprehensive Metabolic Panel (CMET) - Lipid Profile - TSH  2. Routine adult health maintenance - CBC with Differential - Comprehensive Metabolic Panel (CMET) - Lipid Profile - TSH  3. Screening for cholesterol level - Lipid Profile  4. Fatigue, unspecified type - TSH   She will schedule annual wellness with labs as soon as possible, weight loss medication will be discussed after seeing overall health.  Last Pap smear done 2017, she is overdue for this, she has appointment in Feb. With GYN for pap.   Agrees with plan of care discussed today. Understands warning signs to seek further care: chest pain, shortness of breath, any significant change in health.  Understands to follow-up with annual wellness exam, labs 1 week prior to that visit.  Weight loss medication, Mar, will be considered after seeing results of laboratory work.  She understands that we must make sure she is in the best possible health before starting this medication.  As stated above, she has no chronic conditions currently being treated.  Wishes to avoid this for the future.   Bernie Covey, NP  04/28/2020

## 2020-05-17 ENCOUNTER — Other Ambulatory Visit: Payer: Self-pay | Admitting: Women's Health

## 2020-05-19 ENCOUNTER — Encounter: Payer: Self-pay | Admitting: Women's Health

## 2020-05-19 ENCOUNTER — Other Ambulatory Visit (HOSPITAL_COMMUNITY)
Admission: RE | Admit: 2020-05-19 | Discharge: 2020-05-19 | Disposition: A | Discharge status: Home / Self Care | Payer: Managed Care, Other (non HMO) | Source: Ambulatory Visit | Attending: Obstetrics & Gynecology | Admitting: Obstetrics & Gynecology

## 2020-05-19 ENCOUNTER — Other Ambulatory Visit: Payer: Self-pay

## 2020-05-19 ENCOUNTER — Ambulatory Visit (INDEPENDENT_AMBULATORY_CARE_PROVIDER_SITE_OTHER): Payer: Managed Care, Other (non HMO) | Admitting: Women's Health

## 2020-05-19 VITALS — BP 132/82 | HR 91 | Ht 64.0 in | Wt 320.8 lb

## 2020-05-19 DIAGNOSIS — Z3041 Encounter for surveillance of contraceptive pills: Secondary | ICD-10-CM

## 2020-05-19 DIAGNOSIS — Z01419 Encounter for gynecological examination (general) (routine) without abnormal findings: Secondary | ICD-10-CM | POA: Diagnosis not present

## 2020-05-19 MED ORDER — DESOGESTREL-ETHINYL ESTRADIOL 0.15-30 MG-MCG PO TABS: ORAL_TABLET | ORAL | 3 refills | Status: DC

## 2020-05-19 MED ORDER — OMEPRAZOLE 20 MG PO CPDR: 20.0000 mg | DELAYED_RELEASE_CAPSULE | Freq: Every day | ORAL | 3 refills | Status: DC

## 2020-05-19 NOTE — Progress Notes (Signed)
WELL-WOMAN EXAMINATION Patient name: Margaret Pierce MRN [id number]  Date of birth: 04/16/1990 Chief Complaint:   Gynecologic Exam  History of Present Illness:   Margaret Pierce is a 30 y.o. G53P1001 Caucasian female being seen today for a routine well-woman exam.  Current complaints: odor from sweating in vaginal area, under breasts ,etc. Working w/ PCP to start w/ weight loss efforts.   Depression screen [facility] 2/9 05/19/2020 11/01/2017 01/17/2017 12/26/2015  Decreased Interest 0 0 0 0  Down, Depressed, Hopeless 0 0 0 0  PHQ - 2 Score 0 0 0 0  Altered sleeping 1 0 - -  Tired, decreased energy 1 0 - -  Change in appetite 1 0 - -  Feeling bad or failure about yourself  1 0 - -  Trouble concentrating 0 0 - -  Moving slowly or fidgety/restless 0 0 - -  Suicidal thoughts 0 0 - -  PHQ-9 Score 4 0 - -     PCP: [facility]      does not desire labs, done w/ PCP Patient's last menstrual period was 04/22/2020 (exact date). The current method of family planning is OCP (estrogen/progesterone).  Last pap 12/26/15. Results were: NILM w/ HRHPV not done. H/O abnormal pap: no Last mammogram: never. Results were: N/A. Family h/o breast cancer: no Last colonoscopy: never. Results were: N/A. Family h/o colorectal cancer: yes MGF Review of Systems:   Pertinent items are noted in HPI Denies any headaches, blurred vision, fatigue, shortness of breath, chest pain, abdominal pain, abnormal vaginal discharge/itching/odor/irritation, problems with periods, bowel movements, urination, or intercourse unless otherwise stated above. Pertinent History Reviewed:  Reviewed past medical,surgical, social and family history.  Reviewed problem list, medications and allergies. Physical Assessment:   Vitals:   05/19/20 1345  BP: 132/82  Pulse: 91  Weight: (!) 320 lb 12.8 oz (145.5 kg)  Height: 5\' 4"  (1.626 m)  Body mass index is 55.07 kg/m.        Physical Examination:   General appearance - well appearing, and in no  distress  Mental status - alert, oriented to person, place, and time  Psych:  She has a normal mood and affect  Skin - warm and dry, normal color, no suspicious lesions noted  Chest - effort normal, all lung fields clear to auscultation bilaterally  Heart - normal rate and regular rhythm  Neck:  midline trachea, no thyromegaly or nodules  Breasts - breasts appear normal, no suspicious masses, no skin or nipple changes or  axillary nodes  Abdomen - soft, nontender, nondistended, no masses or organomegaly  Pelvic - VULVA: normal appearing vulva with no masses, tenderness or lesions  VAGINA: normal appearing vagina with normal color and discharge, no lesions  CERVIX: normal appearing cervix without discharge or lesions, no CMT  Thin prep pap is done w/ HR HPV cotesting  UTERUS: uterus is felt to be normal size, shape, consistency and nontender   ADNEXA: No adnexal masses or tenderness noted.  Extremities:  No swelling or varicosities noted  Chaperone: Angel Neas    No results found for this or any previous visit (from the past 24 hour(s)).  Assessment & Plan:  1) Well-Woman Exam  2) Vaginal and breast odor when sweating> can try Lume deoderant  3) Contraception management> refilled COCs  4) Acid reflux> refilled prilosec  Labs/procedures today: pap  Mammogram: @ 28yo, or sooner if problems Colonoscopy: @ 28yo, or sooner if problems  No orders of the defined types were  placed in this encounter.   Meds:  Meds ordered this encounter  Medications  . desogestrel-ethinyl estradiol (ISIBLOOM) 0.15-30 MG-MCG tablet    Sig: TAKE ONE (1) TABLET EACH DAY    Dispense:  90 tablet    Refill:  3    Order Specific Question:   Supervising Provider    Answer:   EURE, LUTHER H [[id number]]  . omeprazole (PRILOSEC) 20 MG capsule    Sig: Take 1 capsule (20 mg total) by mouth daily.    Dispense:  90 capsule    Refill:  3    Order Specific Question:   Supervising Provider    Answer:   Lazaro Arms  [[id number]]    Follow-up: Return in about 1 year (around 05/19/2021) for Physical.  Cheral Marker CNM, WHNP-BC 05/19/2020 2:12 PM

## 2020-05-19 NOTE — Patient Instructions (Signed)
Lume deoderant

## 2020-05-20 LAB — CBC WITH DIFFERENTIAL/PLATELET
Basophils Absolute: 0.1 10*3/uL (ref 0.0–0.2)
Basos: 1 %
EOS (ABSOLUTE): 0.2 10*3/uL (ref 0.0–0.4)
Eos: 2 %
Hematocrit: 41.4 % (ref 34.0–46.6)
Hemoglobin: 13.8 g/dL (ref 11.1–15.9)
Immature Grans (Abs): 0 10*3/uL (ref 0.0–0.1)
Immature Granulocytes: 0 %
Lymphocytes Absolute: 3.2 10*3/uL — ABNORMAL HIGH (ref 0.7–3.1)
Lymphs: 31 %
MCH: 30.5 pg (ref 26.6–33.0)
MCHC: 33.3 g/dL (ref 31.5–35.7)
MCV: 92 fL (ref 79–97)
Monocytes Absolute: 0.8 10*3/uL (ref 0.1–0.9)
Monocytes: 8 %
Neutrophils Absolute: 6.1 10*3/uL (ref 1.4–7.0)
Neutrophils: 58 %
Platelets: 245 10*3/uL (ref 150–450)
RBC: 4.52 x10E6/uL (ref 3.77–5.28)
RDW: 11.8 % (ref 11.7–15.4)
WBC: 10.4 10*3/uL (ref 3.4–10.8)

## 2020-05-20 LAB — LIPID PANEL
Chol/HDL Ratio: 2.8 ratio (ref 0.0–4.4)
Cholesterol, Total: 129 mg/dL (ref 100–199)
HDL: 46 mg/dL (ref >39)
LDL Chol Calc (NIH): 59 mg/dL (ref 0–99)
Triglycerides: 140 mg/dL (ref 0–149)
VLDL Cholesterol Cal: 24 mg/dL (ref 5–40)

## 2020-05-20 LAB — COMPREHENSIVE METABOLIC PANEL
ALT: 123 IU/L — ABNORMAL HIGH (ref 0–32)
AST: 103 IU/L — ABNORMAL HIGH (ref 0–40)
Albumin/Globulin Ratio: 1.3 (ref 1.2–2.2)
Albumin: 4 g/dL (ref 3.9–5.0)
Alkaline Phosphatase: 110 IU/L (ref 44–121)
BUN/Creatinine Ratio: 15 (ref 9–23)
BUN: 10 mg/dL (ref 6–20)
Bilirubin Total: 0.5 mg/dL (ref 0.0–1.2)
CO2: 21 mmol/L (ref 20–29)
Calcium: 9.3 mg/dL (ref 8.7–10.2)
Chloride: 101 mmol/L (ref 96–106)
Creatinine, Ser: 0.65 mg/dL (ref 0.57–1.00)
GFR calc Af Amer: 141 mL/min/{1.73_m2} (ref >59)
GFR calc non Af Amer: 122 mL/min/{1.73_m2} (ref >59)
Globulin, Total: 3.1 g/dL (ref 1.5–4.5)
Glucose: 110 mg/dL — ABNORMAL HIGH (ref 65–99)
Potassium: 4 mmol/L (ref 3.5–5.2)
Sodium: 138 mmol/L (ref 134–144)
Total Protein: 7.1 g/dL (ref 6.0–8.5)

## 2020-05-20 LAB — TSH: TSH: 2.94 u[IU]/mL (ref 0.450–4.500)

## 2020-05-23 LAB — CYTOLOGY - PAP
Adequacy: ABSENT
Comment: NEGATIVE
Diagnosis: NEGATIVE
High risk HPV: NEGATIVE

## 2020-05-25 ENCOUNTER — Other Ambulatory Visit: Payer: Self-pay

## 2020-05-25 ENCOUNTER — Ambulatory Visit (INDEPENDENT_AMBULATORY_CARE_PROVIDER_SITE_OTHER): Payer: Managed Care, Other (non HMO) | Admitting: Family Medicine

## 2020-05-25 ENCOUNTER — Telehealth: Payer: Self-pay | Admitting: *Deleted

## 2020-05-25 ENCOUNTER — Encounter: Payer: Self-pay | Admitting: Family Medicine

## 2020-05-25 DIAGNOSIS — D649 Anemia, unspecified: Secondary | ICD-10-CM | POA: Diagnosis not present

## 2020-05-25 DIAGNOSIS — R748 Abnormal levels of other serum enzymes: Secondary | ICD-10-CM | POA: Diagnosis not present

## 2020-05-25 NOTE — Addendum Note (Signed)
Addended by: Metro Kung on: 05/25/2020 11:57 AM   Modules accepted: Orders

## 2020-05-25 NOTE — Patient Instructions (Addendum)
Dr. Dossie Arbour     reventing Consequences of Unhealthy Weight Loss Behaviors, Adult Reaching and maintaining a healthy weight is important for your overall health. A healthy weight will vary from person to person. It is natural to want to lose weight quickly, using whatever methods seem fastest. However, losing weight in a healthy way is not a quick process. Instead, aim for slow, steady weight loss by making small changes and setting achievable goals. How can unhealthy weight loss behaviors affect me? Using unhealthy behaviors to try to lose weight can cause:  Tiredness, low heart rate, and low blood pressure.  Imbalances in your body. These may be imbalances in: ? Electrolytes. These are salts and minerals in your blood. ? Chemicals. These are needed so your body can work properly. ? Body fluids. Loss of fluids may lead to dehydration.  Organ damage or organ failure, especially affecting the kidneys.  Thin bones that break easily.  Staying away from others, or relationship problems with your friends and family.  Emotional problems, including depression and anxiety.  A greater risk of an eating disorder. If you develop an eating disorder, you could develop serious health problems and complications that affect your organs and bodily processes. Changing unhealthy weight loss behaviors through lifestyle changes will improve your overall health. Maintaining a healthy weight also lowers your risk of certain conditions, such as:  Type 2 diabetes.  Heart disease, high cholesterol, and high blood pressure.  Osteoarthritis. This affects your joints.  Osteoporosis. This affects your bones.  Some cancers. What can increase my risk for unhealthy weight loss behaviors? Certain views or feelings about yourself and certain habits can increase your risk of unhealthy weight loss behaviors. These include:  Having depression and being overweight as an adult.  Attempting weight loss as a child  or teen.  Using alcohol, drugs, or tobacco products. What actions can I take to prevent these behaviors? You can make certain lifestyle changes to help you lose weight in a healthy way. These include eating nutritious foods and exercising regularly. Nutrition  Eat a variety of healthy foods, including fruits and vegetables, whole grains, lean proteins, and low-fat dairy products.  Drink water instead of sugary drinks.  Drink enough fluid to keep your urine pale yellow.  Plan healthy, balanced meals. Work with a Dealer (dietitian) to make a healthy meal plan that works for you.  Limit the following: ? Foods that are high in fat, salt (sodium), or sugar. These include candy, donuts, pizza, and fast foods. ? [place] or heavily processed foods. ? Drinks that contain a lot of sugar.   Lifestyle  Avoid these unhealthy eating habits: ? Following a diet that restricts entire types of food. This may be a popular diet that promises extreme results in a short time. ? Skipping meals to save calories. ? Not eating anything for long periods of time (fasting). ? Restricting your calories to far fewer than the number that you need to lose or maintain a healthy weight. ? Taking laxative pills to make you have more frequent bowel movements. ? Taking medicines to make your body lose excess fluids (diuretics). ? Eating an excessive amount of food and then making yourself vomit. This is known as bingeing and purging.  If you drink alcohol: ? Limit how much you use to:  0-1 drink a day for nonpregnant women.  0-2 drinks a day for men. ? Be aware of how much alcohol is in your drink. In the [place] one  drink equals one 12 oz bottle of beer (355 mL), one 5 oz glass of wine (148 mL), or one 1 oz glass of hard liquor (44 mL).  Do not use any products that contain nicotine or tobacco, such as cigarettes, e-cigarettes, and chewing tobacco. If you need help quitting, ask your health care  provider. Activity  Avoid compulsively getting an extreme amount of exercise.  Work with a Data processing manager to make a healthy exercise program. ? Include different types of exercise in your exercise program, such as strengthening, aerobic, and flexibility exercises. ? To maintain your weight, get at least 150 minutes of moderate-intensity exercise each week. Moderate-intensity exercise could be brisk walking or biking. ? To lose a healthy amount of weight, get 60 minutes of moderate-intensity exercise each day.  Find ways to reduce stress, such as regular exercise or meditation.  Find a hobby or other activity that you enjoy to distract you from eating when you feel stressed or bored.   Where to find support For more support, talk with:  Your health care provider or dietitian. Ask about support groups.  A mental health care provider.  Family and friends. Where to find more information Learn more about how to prevent complications from unhealthy weight loss behaviors from:  Centers for Disease Control and Prevention: FootballExhibition.com.br  [employer] of Mental Health: http://www.maynard.net/  National Eating Disorders Association: www.nationaleatingdisorders.org Contact a health care provider if:  You often feel very tired.  You notice changes in your skin or your hair.  You faint because of dehydration or too much exercise.  You struggle to change your unhealthy weight loss behaviors on your own.  Unhealthy weight loss behaviors are affecting your daily life or your relationships.  You have signs or symptoms of an eating disorder.  You have major weight changes in a short period of time.  You feel guilty or ashamed about eating or exercising. Summary  Using unhealthy eating behaviors to try to lose weight can cause a variety of physical and emotional problems that affect your overall health and well-being.  Aim for slow, steady weight loss by choosing healthy foods, avoiding  unhealthy eating habits, and exercising regularly.  Contact your health care provider if you struggle to change your behaviors on your own or if you think that you may have an eating disorder. This information is not intended to replace advice given to you by your health care provider. Make sure you discuss any questions you have with your health care provider. Document Revised: 02/10/2019 Document Reviewed: 02/10/2019 Elsevier Patient Education  2021 [employer].

## 2020-05-25 NOTE — Addendum Note (Signed)
Addended by: Novella Olive on: 05/25/2020 12:25 PM   Modules accepted: Level of Service

## 2020-05-25 NOTE — Telephone Encounter (Signed)
Message copied below from Dr. Lorin Picket and I called and discussed with pt. Pt verbalized understanding and orders put in.   recommend an ultrasound right upper quadrant attention liver due to elevated liver enzymes. Also recommend hepatitis B surface antigen, hepatitis C antibody, ceruloplasmin level, smooth muscle antibody, also ferritin and TIBC It may be best to have the nurses revise her labs for all of them to be on 1 order and have the patient do the use. More than likely this is fatty liver disease but cannot prove that out based upon what we know now If lab work comes back reassuring and ultrasound shows fatty liver the initial treatment is healthy diet weight loss and repeat liver functions in 6 months-thanks-Dr. Lorin Picket

## 2020-05-25 NOTE — Progress Notes (Addendum)
Patient ID: Margaret Pierce, female    DOB: Mar 27, 1991, 30 y.o.   MRN: [id number]   Chief Complaint  Patient presents with  . Obesity   Subjective:  CC: discuss weight loss medications  This is not a new problem.  Presents today to discuss weight loss medication.  Labs performed to evaluate kidney and liver function, will discuss in detail today.  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  Denies fever, chills, chest pain, shortness of breath.  Endorses good exercise endurance.  Does not participate in a formal exercise program, due to busy lifestyle.   discuss weight loss injections.    Medical History Margaret Pierce has a past medical history of ADD (attention deficit disorder), Contraceptive management (08/19/2013), Dysmenorrhea (06/26/2012), Hematuria (03/22/2014), IBS (irritable bowel syndrome) (11/25/2014), Menorrhagia (06/26/2012), RLQ abdominal pain (03/22/2014), Urinary frequency (03/22/2014), and Weight gain (08/19/2013).   Outpatient Encounter Medications as of 05/25/2020  Medication Sig  . cetirizine (ZYRTEC) 10 MG tablet Take 10 mg by mouth daily.  Marland Kitchen desogestrel-ethinyl estradiol (ISIBLOOM) 0.15-30 MG-MCG tablet TAKE ONE (1) TABLET EACH DAY  . omeprazole (PRILOSEC) 20 MG capsule Take 1 capsule (20 mg total) by mouth daily.   No facility-administered encounter medications on file as of 05/25/2020.     Review of Systems  Constitutional: Negative for chills and fever.  Respiratory: Negative for shortness of breath.   Cardiovascular: Negative for chest pain.  Gastrointestinal: Negative for abdominal pain.  Neurological: Negative for dizziness, numbness and headaches.       History of migraines.      Vitals BP 124/80   Pulse 98   Temp (!) 97 F (36.1 C)   Ht 5\' 4"  (1.626 m)   Wt (!) 317 lb (143.8 kg)   SpO2 100%   BMI 54.41 kg/m   Objective:   Physical Exam Vitals reviewed.  Constitutional:       Appearance: Normal appearance.  Cardiovascular:     Rate and Rhythm: Normal rate and regular rhythm.     Heart sounds: Normal heart sounds.  Pulmonary:     Effort: Pulmonary effort is normal.     Breath sounds: Normal breath sounds.  Skin:    General: Skin is warm and dry.  Neurological:     General: No focal deficit present.     Mental Status: She is alert.  Psychiatric:        Behavior: Behavior normal.      Assessment and Plan   1. Morbid obesity (HCC) - Amb Ref to Medical Weight Management - Ferritin - Iron Binding Cap (TIBC)([facility]) - Mitochondrial/smooth muscle ab pnl - Ceruloplasmin - Hepatitis C antibody - Hepatitis B surface antibody,qualitative  2. Anemia, unspecified type - Ferritin - Iron Binding Cap (TIBC)([facility]) - Mitochondrial/smooth muscle ab pnl - Ceruloplasmin - Hepatitis C antibody - Hepatitis B surface antibody,qualitative  3. Elevated liver enzymes - Ferritin - Iron Binding Cap (TIBC)([facility]) - Mitochondrial/smooth muscle ab pnl - Ceruloplasmin - Hepatitis C antibody - Hepatitis B surface antibody,qualitative - Abdomen Limited RUQ (LIVER/GB)   Labs reviewed in detail, H&H are low, will get iron studies.  Reports history of iron deficiency anemia.  Liver enzymes elevated, contraindicated to go on Saxenda with hepatic issues.  Lifestyle discussed, wishes to be referred to the weight loss clinic. Discussed healthy keto to incorporate healthy fats, low carbohydrates, and lots of vegetables daily.   Agrees with plan of care discussed  today. Understands warning signs to seek further care: chest pain, shortness of breath, any significant change in health.  Understands to follow-up in 6 months to reevaluate liver enzymes.  Referral made to weight loss clinic.  Return to this office as needed.  Will notify of lab results once become available.  Update: consulted with Dr. Lilyan Punt concerning elevated liver  enzymes. Additional labs and ultrasound ordered. Nurse notified patient.   Dorena Bodo, NP 05/25/2020

## 2020-05-26 LAB — HEPATITIS B SURFACE ANTIBODY,QUALITATIVE: Hep B Surface Ab, Qual: NONREACTIVE

## 2020-05-26 LAB — IRON AND TIBC
Iron Saturation: 24 % (ref 15–55)
Iron: 86 ug/dL (ref 27–159)
Total Iron Binding Capacity: 362 ug/dL (ref 250–450)
UIBC: 276 ug/dL (ref 131–425)

## 2020-05-26 LAB — MITOCHONDRIAL/SMOOTH MUSCLE AB PNL
Mitochondrial Ab: <20 Units (ref 0.0–20.0)
Smooth Muscle Ab: 7 Units (ref 0–19)

## 2020-05-26 LAB — FERRITIN: Ferritin: 230 ng/mL — ABNORMAL HIGH (ref 15–150)

## 2020-05-26 LAB — HEPATITIS C ANTIBODY: Hep C Virus Ab: <0.1 s/co ratio (ref 0.0–0.9)

## 2020-05-26 LAB — CERULOPLASMIN: Ceruloplasmin: 46.6 mg/dL — ABNORMAL HIGH (ref 19.0–39.0)

## 2020-06-03 ENCOUNTER — Other Ambulatory Visit: Payer: Self-pay

## 2020-06-03 ENCOUNTER — Ambulatory Visit (HOSPITAL_COMMUNITY)
Admission: RE | Admit: 2020-06-03 | Discharge: 2020-06-03 | Disposition: A | Discharge status: Home / Self Care | Payer: Managed Care, Other (non HMO) | Source: Ambulatory Visit | Attending: Family Medicine | Admitting: Family Medicine

## 2020-06-03 DIAGNOSIS — R748 Abnormal levels of other serum enzymes: Secondary | ICD-10-CM | POA: Insufficient documentation

## 2020-06-04 ENCOUNTER — Encounter: Payer: Self-pay | Admitting: Family Medicine

## 2020-06-06 ENCOUNTER — Encounter: Payer: Self-pay | Admitting: Nutrition

## 2020-06-06 ENCOUNTER — Other Ambulatory Visit: Payer: Self-pay

## 2020-06-06 ENCOUNTER — Encounter: Payer: Managed Care, Other (non HMO) | Attending: Family Medicine | Admitting: Nutrition

## 2020-06-06 NOTE — Patient Instructions (Signed)
Goals  Follow MY Plate Measure foods out Keep food journal Drink a gallon of water Cut out snacking Lose 1 lb per week

## 2020-06-06 NOTE — Progress Notes (Signed)
Medical Nutrition Therapy  Appointment Start time:  1540  Appointment End time:  1700  Primary concerns today: Obesity. Wants to lose weight Referral diagnosis:E 66.9 Preferred learning style:  no preference indicated Learning readiness: ready NUTRITION ASSESSMENT  Morbid Obesity. Elevated liver enzymes. Lost 6 lbs in the last month. Elevated glucose. Has a fatty liver with elevated liver enzymes. She would benefit from some weight loss medication assistance to help with her dietary changes and activity to lose weight.  Anthropometrics  Wt Readings from Last 3 Encounters:  06/06/20 (!) 314 lb (142.4 kg)  05/25/20 (!) 317 lb (143.8 kg)  05/19/20 (!) 320 lb 12.8 oz (145.5 kg)   Ht Readings from Last 3 Encounters:  06/06/20 5\' 4"  (1.626 m)  05/25/20 5\' 4"  (1.626 m)  05/19/20 5\' 4"  (1.626 m)   Body mass index is 53.9 kg/m. @BMIFA @ Facility age limit for growth percentiles is 20 years. Facility age limit for growth percentiles is 20 years. CMP Latest Ref Rng & Units 05/19/2020 05/30/2018 03/17/2018  Glucose 65 - 99 mg/dL ) - 05/21/2020)  BUN 6 - 20 mg/dL 10 - 12  Creatinine 06/01/2018 - 1.00 mg/dL 03/19/2018 734(K 876(O  Sodium 134 - 144 mmol/L 138 - 135  Potassium 3.5 - 5.2 mmol/L 4.0 - 3.7  Chloride 96 - 106 mmol/L 101 - 107  CO2 20 - 29 mmol/L 21 - 18(L)  Calcium 8.7 - 10.2 mg/dL 9.3 - 8.4(L)  Total Protein 6.0 - 8.5 g/dL 7.1 - 6.6  Total Bilirubin 0.0 - 1.2 mg/dL 0.5 - 0.8  Alkaline Phos 44 - 121 IU/L 110 - 106  AST 0 - 40 IU/L 103(H) - 23  ALT 0 - 32 IU/L 123(H) - 23   Lipid Panel     Component Value Date/Time   CHOL 129 05/19/2020 1024   TRIG 140 05/19/2020 1024   HDL 46 05/19/2020 1024   CHOLHDL 2.8 05/19/2020 1024   LDLCALC 59 05/19/2020 1024   LABVLDL 24 05/19/2020 1024    Clinical Medical Hx: Medications: Labs: Morbid obesity, Anemia, IBS Notable Signs/Symptoms:   Lifestyle & Dietary Hx Last baby was  lbs 8.6 oz. Wasn't told she was gestational DM.  Estimated daily  fluid intake:  24oz Supplements: Sleep: 6 hours Stress / self-care: not stressed. 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 Current average weekly physical activity: ADL  24-Hr Dietary Recall First Meal: skips coffee- creamer sweetened Snack: 9 am 2-3 boiled eggs, sausage or bacon or keilbasa,  Muscle milk. Second Meal: Meat and vegetables; chicken wrap with veggies, Diet soda or water Snack: none Third Meal:  Chicken wrap, Water Snack:  Beverages: 2-3 bottles of water; Diet sodas 2  Estimated Energy Needs Calories: 1200 Carbohydrate: 135g Protein: 90g Fat: 33g   NUTRITION DIAGNOSIS  NI-1.5 Excessive energy intake As related to High calorie high fat processed diet.  As evidenced by BMI >40.   NUTRITION INTERVENTION  Nutrition education (E-1) on the following topics:  . Nutrition and pre Diabetes  And weight loss education provided on My Plate, CHO counting, meal planning, portion sizes, timing of meals, avoiding snacks between meals taking medications as prescribed, benefits of exercising 30 minutes per day and prevention of  DM. Weight loss tips, food journal, and importance of tracking foods. Cooking at home. .   Handouts Provided Include   The Plate Method   Meal Plan Card  Weight loss instructions.  Learning Style & Readiness for Change Teaching method utilized: Visual & Auditory  Demonstrated degree of understanding via: Teach Back  Barriers to learning/adherence to lifestyle change: medication assistance  Goals Established by Pt .  Goals  Follow MY Plate Measure foods out Keep food journal Drink a gallon of water Cut out snacking Lose 1 lb per week MONITORING & EVALUATION Dietary intake, weekly physical activity, and weight  in 1 month.  Next Steps  Patient is to start working on meal planning.Marland Kitchen

## 2020-06-12 ENCOUNTER — Encounter: Payer: Self-pay | Admitting: Family Medicine

## 2020-06-12 NOTE — Telephone Encounter (Signed)
Nurses  Please send in Saxenda 3 mg subcutaneous daily With instructions to start off at 0.6 mg subcutaneous daily and increase dosing by 0.6 mg every week until up to 3 mg subcutaneous daily.  Please also discuss with her pharmacy how that should be dispensed in regards to quantity so that she will have month supply with 3 refills  Please send her the following message as well  Dear Margaret Pierce  Upon further investigation I find no contraindication to prescribing the medication based upon elevated liver enzymes.  It should be noted that some individuals are not able to tolerate this medication.  In the small number people it can cause pancreatitis which is evident by severe abdominal pain with vomiting.  Should that occur stop taking the medication and let us know. Approximately 20% will develop nausea and occasional vomiting if this becomes severe it is best to stop the medication.  A very small amount of individuals can become depressed and potentially suicidal on the medicine.  That is a rare side effect which should you start find yourself feeling depressed it is best to stop the medicine right away let us know.  It is best to start off with 0.6 mg subcutaneous daily for the first 7 days then increase by 0.6 mg weekly until at a maximum of 3 mg subcutaneous daily It will take you approximately 5 weeks to work up to the maximum dose If you find a particular dose is causing you significant troubles that could be a sign that we would need to reduce it to the previous steps. It is important for you to give [place] feedback regarding how things are going And also it is important to do a follow-up office visit in 3 months.  If you are failing to lose weight on this medication then by standard protocol this medication would not be able to be continued. Finally some insurance companies will cover this medication some do not.  Sometimes this is a process of getting prior approval that we can assist with.  In  other situations need insurance policy precludes the use of the medicine no matter what.  Unfortunately there is no way for [place] to know without submitting it. Should you have questions concerns or problems please let us know.  Please send [place] periodic updates how this medication is doing.  Remember it is very important to adhere to a healthy diet with portion control. Thanks-Dr. Lorin Picket.

## 2020-06-13 MED ORDER — SAXENDA 18 MG/3ML ~~LOC~~ SOPN: PEN_INJECTOR | SUBCUTANEOUS | 3 refills | Status: DC

## 2020-06-13 NOTE — Addendum Note (Signed)
Addended by: Marlowe Shores on: 06/13/2020 12:16 PM   Modules accepted: Orders

## 2020-06-16 ENCOUNTER — Encounter: Payer: Self-pay | Admitting: Family Medicine

## 2020-06-20 ENCOUNTER — Telehealth: Payer: Self-pay | Admitting: Family Medicine

## 2020-06-20 NOTE — Telephone Encounter (Signed)
Phone call We have known this patient for many years.  She is morbidly obese.  Recently her lab work shows fatty liver issues.  We will be following the liver enzymes.  It is medically imperative for her to lose weight.  We have already referred her to dietary and she is met with a dietitian.  She has over the past several months worked on portion control regular physical activity but has been unable to lose any weight.  Her current weight is 314 pounds.  Given this I believe she meets medical indications for Saxenda.  It is medically indicated and falls under FDA guidelines.  We support her with prescribing this medication and close follow-up.

## 2020-06-20 NOTE — Telephone Encounter (Signed)
Nurses (see prior approval form) I was reviewing over the insurance form for prior authorization regarding Saxenda the medication if we prescribed to help her lose weight  Based upon the questions that are being asked in this form patient needs to understand that there is a significant chance that the medication will be denied based around the following: #1 her insurance company requires a much higher criteria than just being significantly overweight #2 it appears that they would lean toward requiring weight loss behavioral modification program for 3 to 6 months with a failure to lose 5% or more body weight-in other words being part of a weight management center   #3 based upon questions it appears that a favor this medication when it is prescribed by a bariatric specialist provider.  I would recommend that we go ahead and submit the prior approval form.  We will represent her in the best light possible but there is a possibility that her insurance company based upon the above will deny this medication.  If it does get denied 1 good option would be referral to healthy weight in management center in [place] where they could do essentially monthly visits with dietary counseling and activity counseling and this would also be under a bariatric specialist and depending on response to that program may or may not prescribe Saxenda  I wish it was as simple as just prescribing the medication but unfortunately it is not. Thanks-Dr. Lorin Picket

## 2020-06-20 NOTE — Telephone Encounter (Signed)
Patient advised per Dr Lorin Picket: Dr Lorin Picket was reviewing over the insurance form for prior authorization regarding Saxenda the medication if we prescribed to help her lose weight   Based upon the questions that are being asked in this form patient needs to understand that there is a significant chance that the medication will be denied based around the following: #1 her insurance company requires a much higher criteria than just being significantly overweight #2 it appears that they would lean toward requiring weight loss behavioral modification program for 3 to 6 months with a failure to lose 5% or more body weight-in other words being part of a weight management center    #3 based upon questions it appears that a favor this medication when it is prescribed by a bariatric specialist provider.   Dr Lorin Picket would recommend that we go ahead and submit the prior approval form.  We will represent her in the best light possible but there is a possibility that her insurance company based upon the above will deny this medication.  If it does get denied 1 good option would be referral to healthy weight in management center in [place] where they could do essentially monthly visits with dietary counseling and activity counseling and this would also be under a bariatric specialist and depending on response to that program may or may not prescribe Saxenda   Dr Lorin Picket wishes it was as simple as just prescribing the medication but unfortunately it is not.  Patient verbalized understanding and will await decision from insurance.

## 2020-06-30 ENCOUNTER — Encounter: Payer: Self-pay | Admitting: Nutrition

## 2020-07-05 ENCOUNTER — Encounter: Payer: Managed Care, Other (non HMO) | Attending: Family Medicine | Admitting: Nutrition

## 2020-07-05 ENCOUNTER — Other Ambulatory Visit: Payer: Self-pay

## 2020-07-05 ENCOUNTER — Encounter: Payer: Self-pay | Admitting: Nutrition

## 2020-07-05 NOTE — Progress Notes (Signed)
Medical Nutrition Therapy  Obesity Follow up Appointment Start time:  0940  Appointment End time:  1020  Primary concerns today: Obesity. Wants to lose weight Referral diagnosis:E 66.9 Preferred learning style:  no preference indicated Learning readiness: ready NUTRITION ASSESSMENT    Follow up : Keeping a food journal. Has helped her stay more accountable. Changes: struggles with water intake but getting in about   64-84 oz of water per day. Feels much better and has a lot more energy. Clothes fitting looser. Lost another 6 lbs.   Didn't get approved for Saxenda. Wants to keep working on weight loss and make lifestyle changes.   Wt Readings from Last 3 Encounters:  07/05/20 (!) 308 lb 9.6 oz (140 kg)  06/06/20 (!) 314 lb (142.4 kg)  05/25/20 (!) 317 lb (143.8 kg)   Ht Readings from Last 3 Encounters:  07/05/20 5\' 4"  (1.626 m)  06/06/20 5\' 4"  (1.626 m)  05/25/20 5\' 4"  (1.626 m)   Body mass index is 52.97 kg/m. @BMIFA @ Facility age limit for growth percentiles is 20 years. Facility age limit for growth percentiles is 20 years.   There is no height or weight on file to calculate BMI. @BMIFA @ Facility age limit for growth percentiles is 20 years. Facility age limit for growth percentiles is 20 years. CMP Latest Ref Rng & Units 05/19/2020 05/30/2018 03/17/2018  Glucose 65 - 99 mg/dL ) - )  BUN 6 - 20 mg/dL 10 - 12  Creatinine 05/21/2020 - 1.00 mg/dL 06/01/2018 03/19/2018 836(O  Sodium 134 - 144 mmol/L 138 - 135  Potassium 3.5 - 5.2 mmol/L 4.0 - 3.7  Chloride 96 - 106 mmol/L 101 - 107  CO2 20 - 29 mmol/L 21 - 18(L)  Calcium 8.7 - 10.2 mg/dL 9.3 - 8.4(L)  Total Protein 6.0 - 8.5 g/dL 7.1 - 6.6  Total Bilirubin 0.0 - 1.2 mg/dL 0.5 - 0.8  Alkaline Phos 44 - 121 IU/L 110 - 106  AST 0 - 40 IU/L 103(H) - 23  ALT 0 - 32 IU/L 123(H) - 23   Lipid Panel     Component Value Date/Time   CHOL 129 05/19/2020 1024   TRIG 140 05/19/2020 1024   HDL 46 05/19/2020 1024   CHOLHDL 2.8 05/19/2020  1024   LDLCALC 59 05/19/2020 1024   LABVLDL 24 05/19/2020 1024    Clinical Medical Hx: Medications: Labs: Morbid obesity, Anemia, IBS Notable Signs/Symptoms:   Lifestyle & Dietary Hx Last baby was  lbs 8.6 oz. Wasn't told she was gestational DM.  Estimated daily fluid intake:  24oz Supplements: Sleep: 6 hours Stress / self-care: not stressed. 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 Current average weekly physical activity: ADL  24-Hr Dietary Recall First Meal: 2 boiled eggs and greek non fat yogurt Snack:  Second Meal: Leafy green lettuce, eggs, tomatoes, chicken and fruit and honey mustard dressing. Snack: orange. Third Meal: Hamburger 1/2 bun, chili and slaw, baked beans, water true lemon Snack:  Beverages:  4 bottles of water.  Estimated Energy Needs Calories: 1200 Carbohydrate: 135g Protein: 90g Fat: 33g   NUTRITION DIAGNOSIS  NI-1.5 Excessive energy intake As related to High calorie high fat processed diet.  As evidenced by BMI >40.   NUTRITION INTERVENTION  Nutrition education (E-1) on the following topics:  . Emotional eating, planning meals, need for increased exercise, eating meals on time . Tracking foods with apps. .   Handouts Provided Include   Information on Myfitness pal and Caloriking.com  Learning Style & Readiness for Change Teaching method utilized: Visual & Auditory  Demonstrated degree of understanding via: Teach Back  Barriers to learning/adherence to lifestyle change: medication assistance  Goals Established by Pt  Goals  Drink 100 oz of water per day Walk 30 minutes three times per week. Lose 1-2 lbs per week Download [place] and Calorieking.com apps. Keep doing food journal.  MONITORING & EVALUATION Dietary intake, weekly physical activity, and weight  in 1 month.  Next Steps  Patient is to start working on meal planning.Marland Kitchen

## 2020-07-05 NOTE — Patient Instructions (Signed)
Goals  Drink 100 oz of water per day Walk 30 minutes three times per week. Lose 1-2 lbs per week Download [employer] and Calorieking.com apps.

## 2020-08-11 ENCOUNTER — Ambulatory Visit: Payer: Managed Care, Other (non HMO) | Admitting: Nutrition

## 2020-08-21 ENCOUNTER — Encounter: Payer: Self-pay | Admitting: Family Medicine

## 2020-09-13 ENCOUNTER — Ambulatory Visit: Payer: Managed Care, Other (non HMO) | Admitting: Nutrition

## 2020-09-27 ENCOUNTER — Encounter: Payer: Managed Care, Other (non HMO) | Attending: Family Medicine | Admitting: Nutrition

## 2020-09-27 ENCOUNTER — Other Ambulatory Visit: Payer: Self-pay

## 2020-09-27 ENCOUNTER — Encounter: Payer: Self-pay | Admitting: Nutrition

## 2020-09-27 NOTE — Patient Instructions (Addendum)
  Goals  Drink 100 oz of water per day Increase exercise to 4 times per week in the pool. Lose 1-2 lbs per week Download [employer] and Calorieking.com apps. Keep doing food journal. Try adding more fiber with dried beans, peas and whole grains and plant based foods.

## 2020-09-27 NOTE — Progress Notes (Signed)
Medical Nutrition Therapy  Obesity Follow up Appointment Start time:  0830   Appointment End time:  0900  Primary concerns today: Obesity. Wants to lose weight Referral diagnosis:E 66.9 Preferred learning style:  no preference indicated Learning readiness: ready NUTRITION ASSESSMENT    Follow up :  Lost 7 lbs.   She has lost a total of 19 lbs in the last 4 months since 05/2020. She has done very well working on making lifestyle changes. Has cut out snacking and eating better quality foods.Cooking at home. Buying meat from the deli and watching portions. Has been working on emotional eating. Been more active in her pool, doing yard work and finding other things to do beside stay inside and eat.   Wt Readings from Last 3 Encounters:  07/05/20 (!) 308 lb 9.6 oz (140 kg)  06/06/20 (!) 314 lb (142.4 kg)  05/25/20 (!) 317 lb (143.8 kg)   Ht Readings from Last 3 Encounters:  07/05/20 5\' 4"  (1.626 m)  06/06/20 5\' 4"  (1.626 m)  05/25/20 5\' 4"  (1.626 m)   There is no height or weight on file to calculate BMI. @BMIFA @ Facility age limit for growth percentiles is 20 years. Facility age limit for growth percentiles is 20 years.   There is no height or weight on file to calculate BMI. @BMIFA @ Facility age limit for growth percentiles is 20 years. Facility age limit for growth percentiles is 20 years. CMP Latest Ref Rng & Units 05/19/2020 05/30/2018 03/17/2018  Glucose 65 - 99 mg/dL ) - )  BUN 6 - 20 mg/dL 10 - 12  Creatinine 05/21/2020 - 1.00 mg/dL 06/01/2018 03/19/2018 621(H  Sodium 134 - 144 mmol/L 138 - 135  Potassium 3.5 - 5.2 mmol/L 4.0 - 3.7  Chloride 96 - 106 mmol/L 101 - 107  CO2 20 - 29 mmol/L 21 - 18(L)  Calcium 8.7 - 10.2 mg/dL 9.3 - 8.4(L)  Total Protein 6.0 - 8.5 g/dL 7.1 - 6.6  Total Bilirubin 0.0 - 1.2 mg/dL 0.5 - 0.8  Alkaline Phos 44 - 121 IU/L 110 - 106  AST 0 - 40 IU/L 103(H) - 23  ALT 0 - 32 IU/L 123(H) - 23   Lipid Panel     Component Value Date/Time   CHOL 129  05/19/2020 1024   TRIG 140 05/19/2020 1024   HDL 46 05/19/2020 1024   CHOLHDL 2.8 05/19/2020 1024   LDLCALC 59 05/19/2020 1024   LABVLDL 24 05/19/2020 1024    Clinical Medical Hx: Medications: Labs: Morbid obesity, Anemia, IBS Notable Signs/Symptoms:   Lifestyle & Dietary Hx Wasn't told she was gestational DM.  Estimated daily fluid intake:  24oz Supplements: Sleep: 6 hours Stress / self-care: not stressed. 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 Current average weekly physical activity: ADL  24-Hr Dietary Recall First Meal: protein bar, yogurt and coffee; or 2 boiled eggs, yogurt and 1 slice ww toast and coffee Snack:  Second Meal:  Salad crispy chicken, corn, beans, water Snack: orange. Third Meal: Cheese burger with some green beans, water. Beverages:  4 bottles of water.  Estimated Energy Needs Calories: 1200 Carbohydrate: 135g Protein: 90g Fat: 33g   NUTRITION DIAGNOSIS  NI-1.5 Excessive energy intake As related to High calorie high fat processed diet.  As evidenced by BMI >40.   NUTRITION INTERVENTION  Nutrition education (E-1) on the following topics:  Emotional eating, planning meals, need for increased exercise, eating meals on time Tracking foods with apps.  Handouts Provided Include  Information on Myfitness pal and Caloriking.com        Low Sodium Recipes  Learning Style & Readiness for Change Teaching method utilized: Visual & Auditory  Demonstrated degree of understanding via: Teach Back  Barriers to learning/adherence to lifestyle change: medication assistance  Goals Established by Pt  Goals  Drink 100 oz of water per day Increase exercise to 4 times per week in the pool. Lose 1-2 lbs per week Download [place]Questions.[employer] and Calorieking.com apps. Keep doing food journal. Try adding more fiber with dried beans, peas and whole grains and plant based foods.   MONITORING & EVALUATION Dietary intake, weekly physical activity, and weight  in  PRN Next Steps  Patient is to continue to work on meal planning.Marland Kitchen

## 2020-10-11 ENCOUNTER — Telehealth: Payer: Managed Care, Other (non HMO) | Admitting: Physician Assistant

## 2020-10-11 DIAGNOSIS — J029 Acute pharyngitis, unspecified: Secondary | ICD-10-CM | POA: Diagnosis not present

## 2020-10-11 NOTE — Progress Notes (Signed)
  E-Visit for Sore Throat  We are sorry that you are not feeling well.  Here is how we plan to help!  Your symptoms indicate a likely viral infection (Pharyngitis).   Pharyngitis is inflammation in the back of the throat which can cause a sore throat, scratchiness and sometimes difficulty swallowing.   Pharyngitis is typically caused by a respiratory virus and will just run its course.  Please keep in mind that your symptoms could last up to 10 days.  For throat pain, we recommend over the counter oral pain relief medications such as acetaminophen or aspirin, or anti-inflammatory medications such as ibuprofen or naproxen sodium.  Topical treatments such as oral throat lozenges or sprays may be used as needed.  Avoid close contact with loved ones, especially the very young and elderly.  Remember to wash your hands thoroughly throughout the day as this is the number one way to prevent the spread of infection and wipe down door knobs and counters with disinfectant.  After careful review of your answers, I would not recommend and antibiotic for your condition.  Antibiotics should not be used to treat conditions that we suspect are caused by viruses like the virus that causes the common cold or flu. However, some people can have Strep with atypical symptoms. You may need formal testing in clinic or office to confirm if your symptoms continue or worsen.  Providers prescribe antibiotics to treat infections caused by bacteria. Antibiotics are very powerful in treating bacterial infections when they are used properly.  To maintain their effectiveness, they should be used only when necessary.  Overuse of antibiotics has resulted in the development of super bugs that are resistant to treatment!    Home Care: Only take medications as instructed by your medical team. Do not drink alcohol while taking these medications. A steam or ultrasonic humidifier can help congestion.  You can place a towel over your head and  breathe in the steam from hot water coming from a faucet. Avoid close contacts especially the very young and the elderly. Cover your mouth when you cough or sneeze. Always remember to wash your hands.  Get Help Right Away If: You develop worsening fever or throat pain. You develop a severe head ache or visual changes. Your symptoms persist after you have completed your treatment plan.  Make sure you Understand these instructions. Will watch your condition. Will get help right away if you are not doing well or get worse.   Thank you for choosing an e-visit.  Your e-visit answers were reviewed by a board certified advanced clinical practitioner to complete your personal care plan. Depending upon the condition, your plan could have included both over the counter or prescription medications.  Please review your pharmacy choice. Make sure the pharmacy is open so you can pick up prescription now. If there is a problem, you may contact your provider through [employer] and have the prescription routed to another pharmacy.  Your safety is important to [place]. If you have drug allergies check your prescription carefully.   For the next 24 hours you can use MyChart to ask questions about today's visit, request a non-urgent call back, or ask for a work or school excuse. You will get an email in the next two days asking about your experience. I hope that your e-visit has been valuable and will speed your recovery.

## 2020-10-11 NOTE — Progress Notes (Signed)
I have spent 5 minutes in review of e-visit questionnaire, review and updating patient chart, medical decision making and response to patient.   Mayerli Kirst Cody Garland H[place], PA-C    

## 2020-10-11 NOTE — Progress Notes (Signed)
Message sent to patient requesting further input regarding current symptoms. Awaiting patient response.  

## 2020-10-20 ENCOUNTER — Encounter: Payer: Self-pay | Admitting: Family Medicine

## 2020-10-22 ENCOUNTER — Other Ambulatory Visit: Payer: Self-pay | Admitting: Adult Health

## 2020-10-24 ENCOUNTER — Other Ambulatory Visit: Payer: Self-pay

## 2020-10-24 ENCOUNTER — Ambulatory Visit (INDEPENDENT_AMBULATORY_CARE_PROVIDER_SITE_OTHER): Payer: Managed Care, Other (non HMO) | Admitting: Family Medicine

## 2020-10-24 VITALS — HR 98 | Temp 97.3°F | Wt 302.0 lb

## 2020-10-24 DIAGNOSIS — J019 Acute sinusitis, unspecified: Secondary | ICD-10-CM

## 2020-10-24 MED ORDER — AMOXICILLIN-POT CLAVULANATE 875-125 MG PO TABS: 1.0000 | ORAL_TABLET | Freq: Two times a day (BID) | ORAL | 0 refills | Status: DC

## 2020-10-24 NOTE — Telephone Encounter (Signed)
We can see them today it would be 4 PM as work-ins

## 2020-10-24 NOTE — Progress Notes (Signed)
   Subjective:    Patient ID: Margaret Pierce, female    DOB: [birth year], 30 y.o.   MRN: [id number]  HPI Patient presents today with respiratory illness Number of days present- 3 weeks   Symptoms include- cough, coughing up thick green mucus, sometimes white. 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   Any current Covid testing- took test 3 weeks ago and was negative Patient has had this for over 3 weeks moderate sinus tenderness consistent with sinusitis at this point  Review of Systems     Objective:   Physical Exam  Gen-NAD not toxic TMS-normal bilateral T- normal no redness Chest-CTA respiratory rate normal no crackles CV RRR no murmur Skin-warm dry Neuro-grossly normal  Moderate sinus tenderness to percussion     Assessment & Plan:  Acute rhinosinusitis Augmentin twice daily for 10 days Patient has taken Augmentin before without any side effects or allergies Occasionally gets nausea with medicine but will take with a snack

## 2020-10-24 NOTE — Telephone Encounter (Signed)
Nurses I was out on vacation last week If family is still sick and needs to be seen let me know I could work him in toward the end of the day depending on the schedule Unfortunately recently schedule has been very tight because of loss of provider from the office

## 2020-10-27 ENCOUNTER — Other Ambulatory Visit: Payer: Self-pay | Admitting: Adult Health

## 2020-10-27 MED ORDER — FLUCONAZOLE 150 MG PO TABS: ORAL_TABLET | ORAL | 1 refills | Status: DC

## 2020-10-27 NOTE — Progress Notes (Signed)
Rx diflucan to [facility]

## 2021-05-10 ENCOUNTER — Other Ambulatory Visit: Payer: Self-pay | Admitting: Adult Health

## 2021-06-28 IMAGING — US US ABDOMEN LIMITED RUQ/ASCITES
1 series · 14 of 25 positions shown · non-contrast
Comparison: None.

CLINICAL DATA: Initial evaluation for elevated liver enzymes.

EXAM:
ULTRASOUND ABDOMEN LIMITED RIGHT UPPER QUADRANT

[Series 1: us abdomen limited ruq (liver/gb) · 14 of 25 slices shown]
[im 1/25]
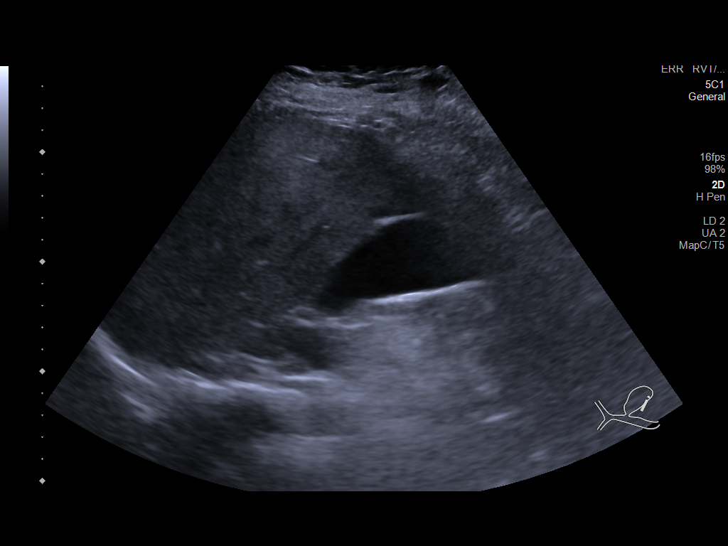
[im 3/25]
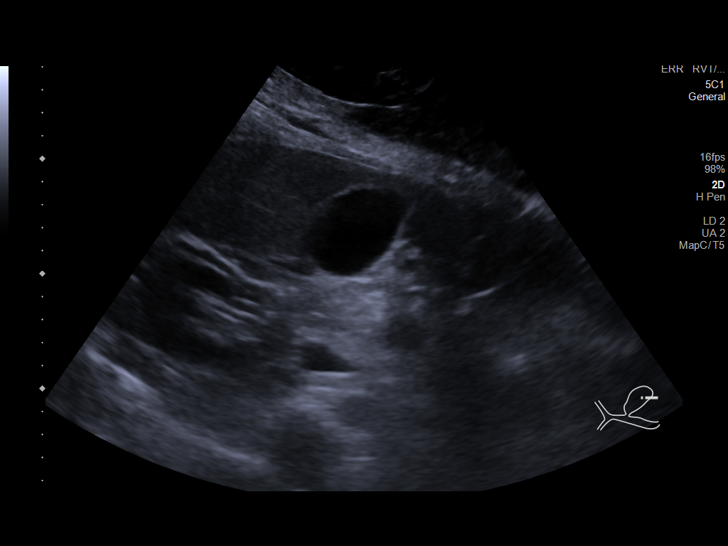
[im 5/25]
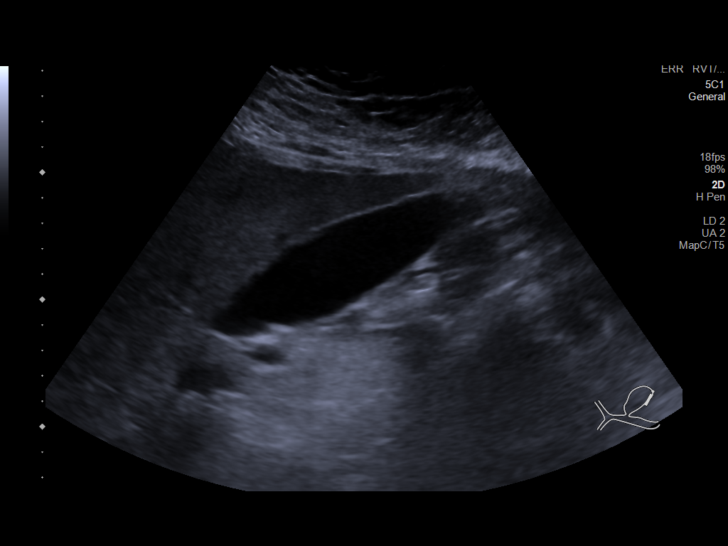
[im 7/25]
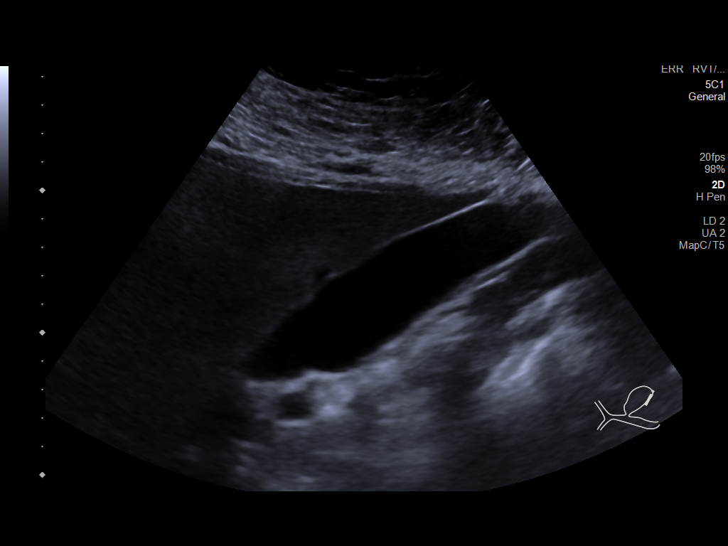
[im 9/25]
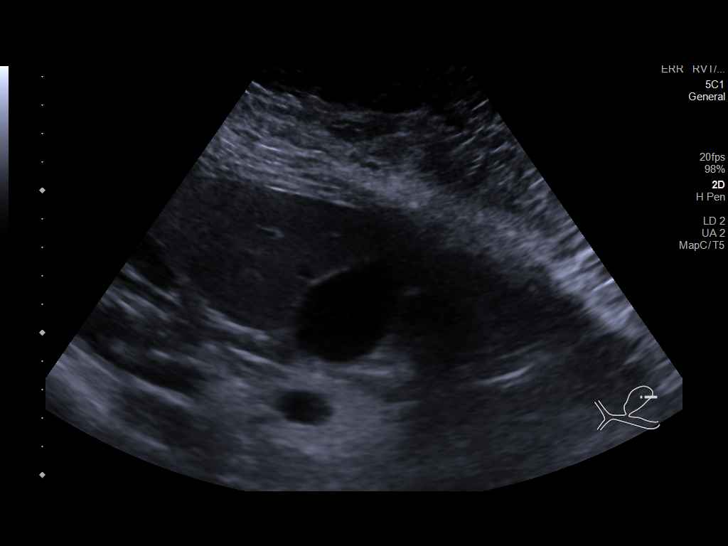
[im 10/25]
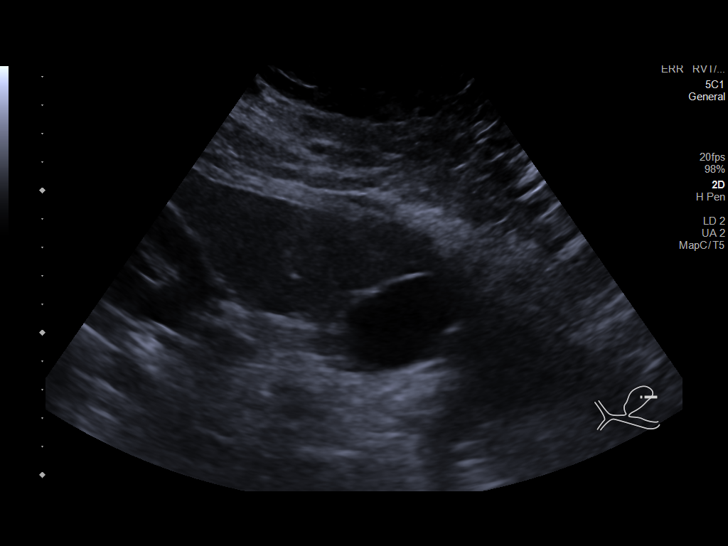
[im 12/25]
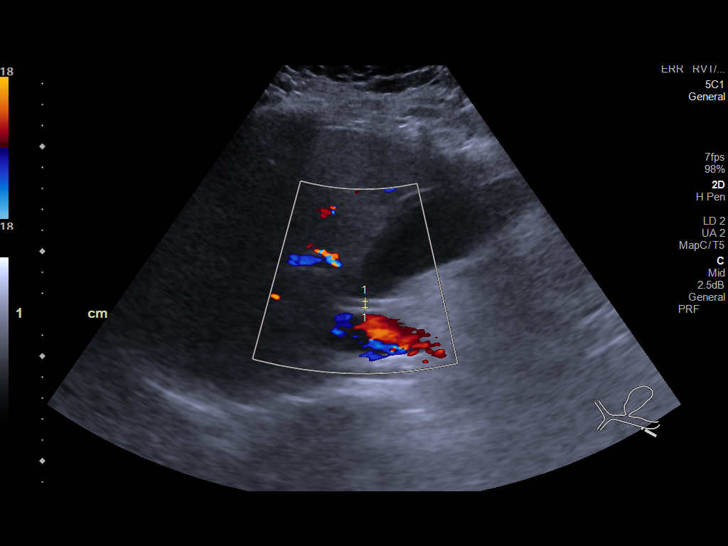
[im 14/25]
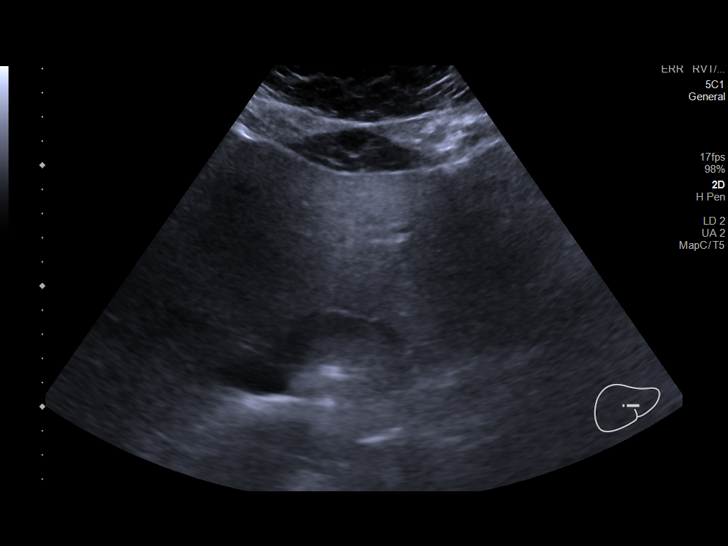
[im 16/25]
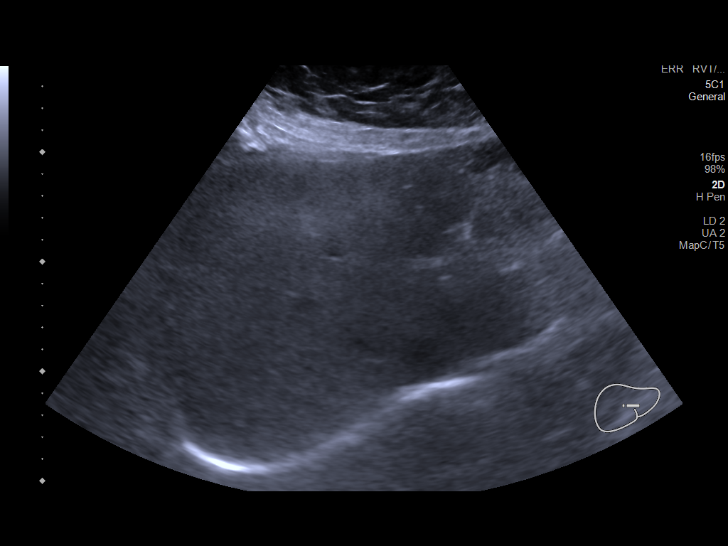
[im 17/25]
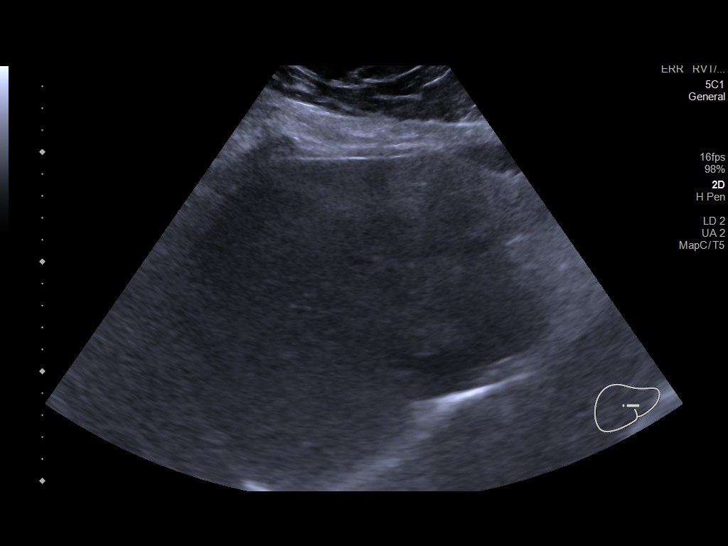
[im 19/25]
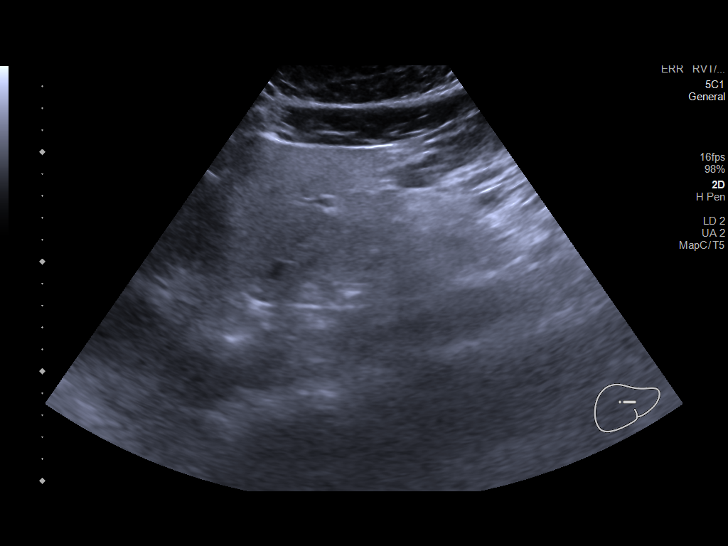
[im 21/25]
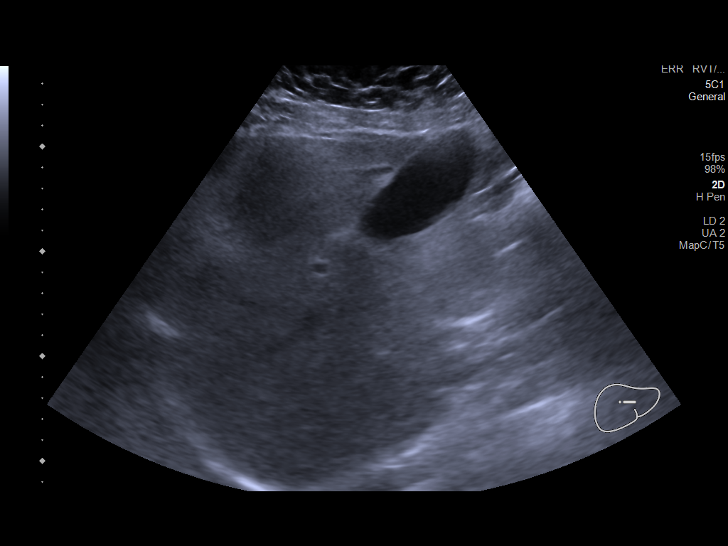
[im 23/25]
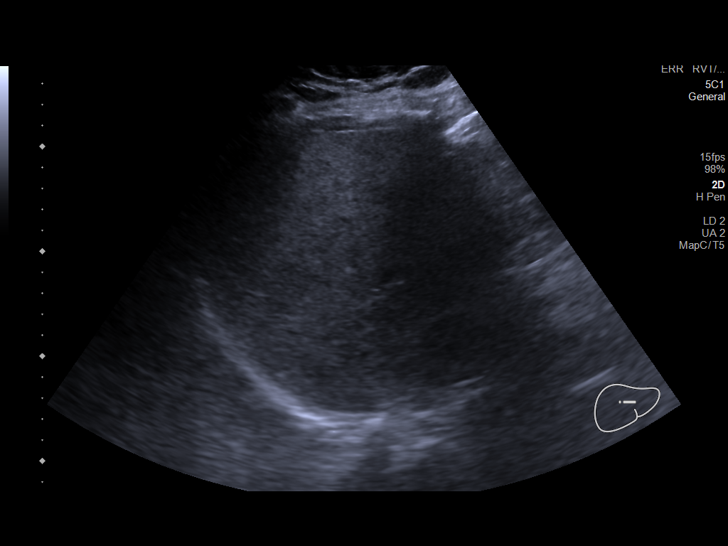
[im 25/25]
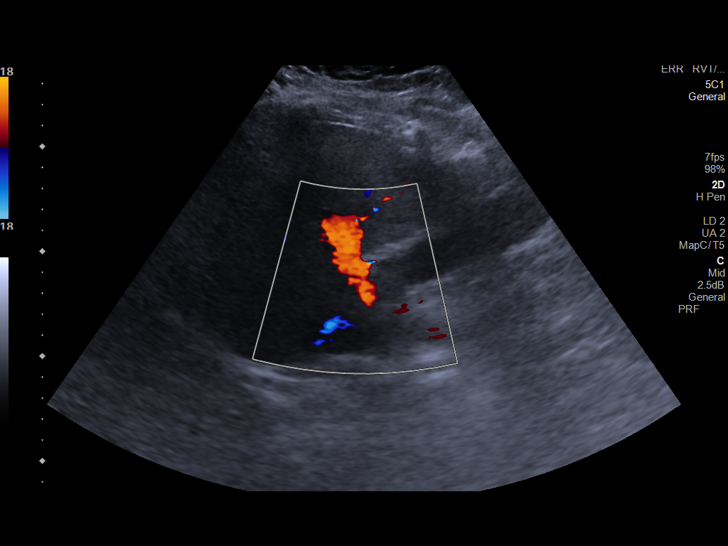

[14 of 25 positions shown; findings below may reference images not displayed]

FINDINGS: Gallbladder:

No gallstones or wall thickening visualized. No sonographic Murphy
sign noted by sonographer.

Common bile duct:

Diameter: 3.1 mm

Liver:

No focal lesion identified. Diffusely increased echogenicity within
the hepatic parenchyma. Portal vein is patent on color Doppler
imaging with normal direction of blood flow towards the liver.

Other: None.
IMPRESSION: 1. Normal sonographic appearance of the gallbladder. No
cholelithiasis or evidence for acute cholecystitis.
2. No biliary dilatation.
3. Diffusely increased echogenicity within the hepatic parenchyma,
suggesting fatty infiltration.

## 2021-07-10 ENCOUNTER — Other Ambulatory Visit: Payer: Managed Care, Other (non HMO) | Admitting: Adult Health

## 2021-07-27 ENCOUNTER — Encounter: Payer: Self-pay | Admitting: Adult Health

## 2021-07-27 ENCOUNTER — Ambulatory Visit (INDEPENDENT_AMBULATORY_CARE_PROVIDER_SITE_OTHER): Payer: Managed Care, Other (non HMO) | Admitting: Adult Health

## 2021-07-27 VITALS — BP 120/77 | HR 75 | Ht 65.0 in | Wt 298.0 lb

## 2021-07-27 DIAGNOSIS — Z01419 Encounter for gynecological examination (general) (routine) without abnormal findings: Secondary | ICD-10-CM | POA: Diagnosis not present

## 2021-07-27 MED ORDER — TRIAMCINOLONE ACETONIDE 0.5 % EX OINT: 1.0000 "application " | TOPICAL_OINTMENT | Freq: Two times a day (BID) | CUTANEOUS | 0 refills | Status: AC

## 2021-07-27 NOTE — Progress Notes (Signed)
Patient ID: Margaret Pierce, female   DOB: 1991/02/06, 31 y.o.   MRN: [id number] ?History of Present Illness: ?Margaret Pierce is a 31 year old white female, married, G1P1001, in for well woman gyn exam. She is on Wegovy and has lost 23 lbs since January. She would like to get pregnant in future.  ?PCP is Lilyan Punt MD. ? ?Lab Results  ?Component Value Date  ? DIAGPAP  05/19/2020  ?  - Negative for intraepithelial lesion or malignancy (NILM)  ? HPVHIGH Negative 05/19/2020  ?  ?Current Medications, Allergies, Past Medical History, Past Surgical History, Family History and Social History were reviewed in [name] record.   ? ? ?Review of Systems: ?Patient denies any headaches, hearing loss, fatigue, blurred vision, shortness of breath, chest pain, abdominal pain, problems with bowel movements, urination, or intercourse. No joint pain or mood swings.  ? ? ? ?Physical Exam:BP 120/77 (BP Location: Right Arm, Patient Position: Sitting, Cuff Size: Normal)   Pulse 75   Ht 5\' 5"  (1.651 m)   Wt 298 lb (135.2 kg)   LMP 07/05/2021   Breastfeeding No   BMI 49.59 kg/m?   ?General:  Well developed, well nourished, no acute distress ?Skin:  Warm and dry ?Neck:  Midline trachea, normal thyroid, good ROM, no lymphadenopathy ?Lungs; Clear to auscultation bilaterally ?Breast:  No dominant palpable mass, retraction, or nipple discharge, area on both breasts that is dry can be itchy at times ?Cardiovascular: Regular rate and rhythm ?Abdomen:  Soft, non tender, no hepatosplenomegaly ?Pelvic:  External genitalia is normal in appearance, no lesions.  The vagina is normal in appearance. Urethra has no lesions or masses. The cervix is bulbous.  Uterus is felt to be normal size, shape, and contour.  No adnexal masses or tenderness noted.Bladder is non tender, no masses felt. ?Rectal: Deferred ?Extremities/musculoskeletal:  No swelling or varicosities noted, no clubbing or cyanosis ?Psych:  No mood changes, alert and  cooperative,seems happy ?AA is 1 ?Fall risk is low ? ?  07/27/2021  ? 10:48 AM 05/19/2020  ?  1:43 PM 11/01/2017  ?  1:05 PM  ?Depression screen PHQ 2/9  ?Decreased Interest 0 0 0  ?Down, Depressed, Hopeless 0 0 0  ?PHQ - 2 Score 0 0 0  ?Altered sleeping 0 1 0  ?Tired, decreased energy 0 1 0  ?Change in appetite 0 1 0  ?Feeling bad or failure about yourself  0 1 0  ?Trouble concentrating 0 0 0  ?Moving slowly or fidgety/restless 0 0 0  ?Suicidal thoughts 0 0 0  ?PHQ-9 Score 0 4 0  ?  ? ?  07/27/2021  ? 10:48 AM 05/19/2020  ?  1:43 PM  ?GAD 7 : Generalized Anxiety Score  ?Nervous, Anxious, on Edge 0 0  ?Control/stop worrying 0 0  ?Worry too much - different things 0 0  ?Trouble relaxing 0 1  ?Restless 0 0  ?Easily annoyed or irritable 0 0  ?Afraid - awful might happen 0 0  ?Total GAD 7 Score 0 1  ? ? Upstream - 07/27/21 1057   ? ?  ? Pregnancy Intention Screening  ? Does the patient want to become pregnant in the next year? Yes   ? Does the patient's partner want to become pregnant in the next year? Yes   ? Would the patient like to discuss contraceptive options today? No   ?  ? Contraception Wrap Up  ? Current Method Withdrawal or Other Method   ?  End Method Withdrawal or Other Method   ? ?  ?  ? ?  ? Examination chaperoned by Malachy Mood LPN ?  ? ?Impression and Plan: ?1. Encounter for well woman exam with routine gynecological exam ?Physical in 1 year ?Pap in 2025  ?I gave her rx for kenalog to use on dry areas on breasts  ?Meds ordered this encounter  ?Medications  ? triamcinolone ointment (KENALOG) 0.5 %  ?  Sig: Apply 1 application. topically 2 (two) times daily.  ?  Dispense:  30 g  ?  Refill:  0  ?  Order Specific Question:   Supervising Provider  ?  Answer:   Duane Lope H [[id number]]  ?  ? ?2. Morbid obesity (HCC) ?Continue weight loss efforts  ? ? ? ?  ?  ?

## 2021-11-14 ENCOUNTER — Other Ambulatory Visit: Payer: Self-pay | Admitting: Adult Health

## 2022-05-31 ENCOUNTER — Encounter: Payer: Self-pay | Admitting: Radiology

## 2022-06-28 ENCOUNTER — Other Ambulatory Visit: Payer: Self-pay | Admitting: Adult Health

## 2022-11-13 ENCOUNTER — Telehealth: Payer: Self-pay

## 2022-11-13 NOTE — Transitions of Care (Post Inpatient/ED Visit) (Signed)
   11/13/2022  Name: Margaret Pierce MRN: [id number] DOB: November 20, 1990  Today's TOC FU Call Status: Today's TOC FU Call Status:: Successful TOC FU Call Completed TOC FU Call Complete Date: 11/13/22  Transition Care Management Follow-up Telephone Call Date of Discharge: 11/12/22 Discharge Facility: Other ([facility]) Name of Other ([facility]) Discharge Facility: [facility] Type of Discharge: Emergency Department Reason for ED Visit: Other: (nausea vomiting) How have you been since you were released from the hospital?: Better Any questions or concerns?: No  Items Reviewed: Did you receive and understand the discharge instructions provided?: Yes Medications obtained,verified, and reconciled?: Yes (Medications Reviewed) Any new allergies since your discharge?: No Dietary orders reviewed?: Yes Do you have support at home?: Yes People in Home: spouse  Medications Reviewed Today: Medications Reviewed Today     Reviewed by Karena Addison, LPN (Licensed Practical Nurse) on 11/13/22 at 1236  Med List Status: <None>   Medication Order Taking? Sig Documenting Provider Last Dose Status Informant  cetirizine (ZYRTEC) 10 MG tablet [id number] No Take 10 mg by mouth daily. [provider] Taking Active Self  omeprazole (PRILOSEC) 20 MG capsule [id number]  TAKE ONE TABLET BY MOUTH DAILY Cyril Mourning A, NP  Active   triamcinolone ointment (KENALOG) 0.5 % [id number]  Apply 1 application. topically 2 (two) times daily. Adline Potter, NP  Active   WEGOVY 1 MG/0.5ML Ivory Broad [id number] No Inject into the skin. [provider] Taking Active             Home Care and Equipment/Supplies: Were Home Health Services Ordered?: NA Any new equipment or medical supplies ordered?: NA  Functional Questionnaire: Do you need assistance with bathing/showering or dressing?: No Do you need assistance with meal preparation?: No Do you need assistance with eating?: No Do you have  difficulty maintaining continence: No Do you need assistance with getting out of bed/getting out of a chair/moving?: No Do you have difficulty managing or taking your medications?: No  Follow up appointments reviewed: PCP Follow-up appointment confirmed?: NA Specialist Hospital Follow-up appointment confirmed?: No Follow-Up Specialty Provider:: OBGYN Reason Specialist Follow-Up Not Confirmed: Patient has Specialist Provider Number and will Call for Appointment Do you need transportation to your follow-up appointment?: No Do you understand care options if your condition(s) worsen?: Yes-patient verbalized understanding    SIGNATURE Karena Addison, LPN [facility] Nurse Health Advisor Direct Dial [phone]

## 2023-01-24 ENCOUNTER — Other Ambulatory Visit: Payer: Self-pay | Admitting: Adult Health

## 2023-06-10 ENCOUNTER — Encounter: Payer: Self-pay | Admitting: *Deleted

## 2023-06-10 ENCOUNTER — Ambulatory Visit: Payer: Self-pay | Admitting: Family Medicine

## 2023-06-10 NOTE — Telephone Encounter (Signed)
  Chief Complaint: cough Symptoms: cough Frequency: intermittent Pertinent Negatives: Patient denies Fever, chest pain other than when coughing.  Disposition: [] ED /[] Urgent Care (no appt availability in office) / [x] Appointment(In office/virtual)/ []  [place] [facility]/ [] Home Care/ [] Refused Recommended Disposition /[] [facility]/ []  Follow-up with PCP Additional Notes:  Kirsty calling for upper respiratory symptoms that are not resolving. She has a productive cough of yellow, green, and brown sputum. Mild shortness of breath intermittently. Afebrile. Feels she has pneumonia. Home Covid 19 test was negative last week. Requesting appointment for herself and son. PCP does not have availability, scheduled with another provider on 06/11/23. Care advised discussed, as well as reasons to call back.   Copied from CRM [id number]. Topic: Clinical - Red Word Triage >> Jun 10, 2023 12:44 PM Albin Felling L wrote: Red Word that prompted transfer to Nurse Triage: Sick for two weeks now, lungs feel heavy and harder to breathe. Reason for Disposition  Coughing up rusty-colored (reddish-brown) sputum  Answer Assessment - Initial Assessment Questions 1. ONSET: "When did the cough begin?"      2 weeks 2. SEVERITY: "How bad is the cough today?"      Same as yesterday, worse in the morning.  3. SPUTUM: "Describe the color of your sputum" (none, dry cough; clear, white, yellow, green)     Green, yellow.  4. HEMOPTYSIS: "Are you coughing up any blood?" If so ask: "How much?" (flecks, streaks, tablespoons, etc.)     No 5. DIFFICULTY BREATHING: "Are you having difficulty breathing?" If Yes, ask: "How bad is it?" (e.g., mild, moderate, severe)    - MILD: No SOB at rest, mild SOB with walking, speaks normally in sentences, can lie down, no retractions, pulse < 100.    - MODERATE: SOB at rest, SOB with minimal exertion and prefers to sit, cannot lie down flat, speaks in phrases, mild retractions, audible  wheezing, pulse 100-120.    - SEVERE: Very SOB at rest, speaks in single words, struggling to breathe, sitting hunched forward, retractions, pulse > 120      Mild 6. FEVER: "Do you have a fever?" If Yes, ask: "What is your temperature, how was it measured, and when did it start?"     Hasn't measured, does not feel feverish.   7. LUNG HISTORY: "Do you have any history of lung disease?"  (e.g., pulmonary embolus, asthma, emphysema)     No 8. PE RISK FACTORS: "Do you have a history of blood clots?" (or: recent major surgery, recent prolonged travel, bedridden)     No 9. OTHER SYMPTOMS: "Do you have any other symptoms?" (e.g., runny nose, wheezing, chest pain)       Throat irritated, back pain, chest pain with cough  10. TRAVEL: "Have you traveled out of the country in the last month?" (e.g., travel history, exposures)       No  Protocols used: Cough - Acute Productive-A-AH

## 2023-06-10 NOTE — Telephone Encounter (Signed)
 Noted.

## 2023-06-11 ENCOUNTER — Encounter: Payer: Self-pay | Admitting: Physician Assistant

## 2023-06-11 ENCOUNTER — Ambulatory Visit: Admitting: Physician Assistant

## 2023-06-11 ENCOUNTER — Ambulatory Visit: Admitting: Nurse Practitioner

## 2023-06-11 VITALS — BP 122/82 | Temp 97.3°F | Wt 311.4 lb

## 2023-06-11 DIAGNOSIS — J069 Acute upper respiratory infection, unspecified: Secondary | ICD-10-CM

## 2023-06-11 DIAGNOSIS — B9689 Other specified bacterial agents as the cause of diseases classified elsewhere: Secondary | ICD-10-CM | POA: Diagnosis not present

## 2023-06-11 MED ORDER — AMOXICILLIN-POT CLAVULANATE 875-125 MG PO TABS
1.0000 | ORAL_TABLET | Freq: Two times a day (BID) | ORAL | 0 refills | Status: AC
Start: 2023-06-11 — End: 2023-06-18

## 2023-06-11 NOTE — Progress Notes (Signed)
   Acute Office Visit  Subjective:     Patient ID: Margaret Pierce, female    DOB: 1990/04/25, 33 y.o.   MRN: [id number]    Patient is in today for concerns of cough and congestion for the past 2 weeks.  She states cough is productive with sputum and experiences occasional wheezing.  She feels like she hears crackles in her lungs occasionally.  She reports nasal congestion, drainage, headaches.  She denies fevers.  She is been taking Tylenol Cold and flu and NyQuil for symptoms she states she feels worse at nighttime.  She reports household sicknesses however states most of her family members have self resolved.   Review of Systems  Constitutional:  Positive for malaise/fatigue. Negative for fever.  HENT:  Positive for congestion. Negative for ear pain and sore throat.   Respiratory:  Positive for cough and sputum production. Negative for shortness of breath.   Cardiovascular:  Negative for chest pain and palpitations.  Musculoskeletal:  Negative for myalgias.  Neurological:  Positive for headaches.        Objective:     BP 122/82   Temp (!) 97.3 F (36.3 C) (Oral)   Wt (!) 311 lb 6.4 oz (141.3 kg)   BMI 51.82 kg/m   Physical Exam Vitals reviewed.  Constitutional:      General: She is not in acute distress.    Appearance: Normal appearance.  HENT:     Nose: Congestion present.     Mouth/Throat:     Mouth: Mucous membranes are moist.     Pharynx: Oropharynx is clear.  Eyes:     Extraocular Movements: Extraocular movements intact.     Conjunctiva/sclera: Conjunctivae normal.  Cardiovascular:     Rate and Rhythm: Normal rate and regular rhythm.     Heart sounds: No murmur heard. Pulmonary:     Effort: Pulmonary effort is normal.     Breath sounds: Normal breath sounds.  Musculoskeletal:        General: Normal range of motion.  Lymphadenopathy:     Cervical: No cervical adenopathy.  Skin:    General: Skin is warm and dry.     Capillary Refill: Capillary refill  takes less than 2 seconds.  Neurological:     General: No focal deficit present.     Mental Status: She is alert and oriented to person, place, and time.  Psychiatric:        Mood and Affect: Mood normal.        Behavior: Behavior normal.     No results found for any visits on 06/11/23.      Assessment & Plan:  Bacterial URI -     Amoxicillin-Pot Clavulanate; Take 1 tablet by mouth 2 (two) times daily for 7 days.  Dispense: 14 tablet; Refill: 0   Patient appears stable today. Benign exam, lungs clear to auscultation bilaterally. Discussed that this fits the picture of viral vs bacterial URI and that due to type and duration of symptoms and exam findings, we will treat as bacterial URI.  Antibiotics prescribed. Advised to continue ibuprofen and Tylenol at home. The patient was instructed to return if the worsens in any way, especially if not tolerating fluids, increased sinus pain or swelling, worsening headache, persistent fever, difficulty swallowing or breathing, or as needed. The patient agreed with the plan.   Return if symptoms worsen or fail to improve.  Toni Amend Damonte Frieson, PA-C

## 2023-09-17 ENCOUNTER — Other Ambulatory Visit: Payer: Self-pay | Admitting: Adult Health

## 2023-11-08 ENCOUNTER — Telehealth: Payer: Self-pay | Admitting: Adult Health

## 2023-11-08 NOTE — Telephone Encounter (Signed)
 Returned pt call, she had period about 2 weeks ago, then started spotted this week got heavier had clots and had negative HPT, she had called OB tha delivered last child and then called here. She finally spoke with them and is getting US  today.  She is not on birth control.

## 2024-04-21 ENCOUNTER — Other Ambulatory Visit: Payer: Self-pay | Admitting: Adult Health
